# Patient Record
Sex: Female | Born: 1985 | Race: White | Hispanic: No | Marital: Single | Smoking: Former smoker
Health system: Southern US, Community
[De-identification: ages and names within clinical notes are randomized; demographics above are authoritative.]

## PROBLEM LIST (undated history)

## (undated) DIAGNOSIS — D649 Anemia, unspecified: Secondary | ICD-10-CM

## (undated) DIAGNOSIS — Z8659 Personal history of other mental and behavioral disorders: Secondary | ICD-10-CM

## (undated) DIAGNOSIS — Z8739 Personal history of other diseases of the musculoskeletal system and connective tissue: Secondary | ICD-10-CM

## (undated) DIAGNOSIS — M7989 Other specified soft tissue disorders: Secondary | ICD-10-CM

## (undated) DIAGNOSIS — F419 Anxiety disorder, unspecified: Secondary | ICD-10-CM

## (undated) DIAGNOSIS — G35D Multiple sclerosis, unspecified: Secondary | ICD-10-CM

## (undated) DIAGNOSIS — F319 Bipolar disorder, unspecified: Secondary | ICD-10-CM

## (undated) DIAGNOSIS — R12 Heartburn: Secondary | ICD-10-CM

## (undated) HISTORY — DX: Personal history of other diseases of the musculoskeletal system and connective tissue: Z87.39

## (undated) HISTORY — DX: Personal history of other mental and behavioral disorders: Z86.59

## (undated) HISTORY — DX: Anemia, unspecified: D64.9

## (undated) HISTORY — DX: Heartburn: R12

## (undated) HISTORY — DX: Multiple sclerosis, unspecified: G35.D

## (undated) HISTORY — DX: Anxiety disorder, unspecified: F41.9

## (undated) HISTORY — PX: NO PAST SURGERIES: SHX2092

## (undated) HISTORY — DX: Other specified soft tissue disorders: M79.89

## (undated) HISTORY — DX: Bipolar disorder, unspecified: F31.9

---

## 2008-04-14 ENCOUNTER — Encounter: Payer: Self-pay | Admitting: Family Medicine

## 2008-08-11 ENCOUNTER — Ambulatory Visit: Payer: Self-pay | Admitting: Family Medicine

## 2008-08-11 DIAGNOSIS — F329 Major depressive disorder, single episode, unspecified: Secondary | ICD-10-CM

## 2008-08-11 DIAGNOSIS — F909 Attention-deficit hyperactivity disorder, unspecified type: Secondary | ICD-10-CM | POA: Insufficient documentation

## 2008-08-11 DIAGNOSIS — F319 Bipolar disorder, unspecified: Secondary | ICD-10-CM | POA: Insufficient documentation

## 2008-08-11 DIAGNOSIS — F17200 Nicotine dependence, unspecified, uncomplicated: Secondary | ICD-10-CM | POA: Insufficient documentation

## 2019-05-12 ENCOUNTER — Ambulatory Visit: Payer: Self-pay | Attending: Internal Medicine

## 2019-05-12 ENCOUNTER — Other Ambulatory Visit: Payer: Self-pay

## 2019-05-12 DIAGNOSIS — Z23 Encounter for immunization: Secondary | ICD-10-CM

## 2019-05-12 NOTE — Progress Notes (Signed)
   Covid-19 Vaccination Clinic  Name:  Cindy Summers    MRN: [id number] DOB: [birth year]  05/12/2019  [name] was observed post Covid-19 immunization for 15 minutes without incident. She was provided with Vaccine Information Sheet and instruction to access the V-Safe system.   [name] was instructed to call 911 with any severe reactions post vaccine: Marland Kitchen Difficulty breathing  . Swelling of face and throat  . A fast heartbeat  . A bad rash all over body  . Dizziness and weakness   Immunizations Administered    Name Date Dose VIS Date Route   Pfizer COVID-19 Vaccine 05/12/2019  9:03 AM 0.3 mL 03/19/2018 Intramuscular   Manufacturer: [employer], [employer]   Lot: [id number]   NDC: [address]-8

## 2019-06-03 ENCOUNTER — Ambulatory Visit: Payer: Self-pay | Attending: Internal Medicine

## 2019-06-03 DIAGNOSIS — Z23 Encounter for immunization: Secondary | ICD-10-CM

## 2019-06-03 NOTE — Progress Notes (Signed)
   Covid-19 Vaccination Clinic  Name:  Cindy Summers    MRN: [id number] DOB: 03/12/1985  06/03/2019  [name] was observed post Covid-19 immunization for 15 minutes without incident. She was provided with Vaccine Information Sheet and instruction to access the V-Safe system.   [name] was instructed to call 911 with any severe reactions post vaccine: Marland Kitchen Difficulty breathing  . Swelling of face and throat  . A fast heartbeat  . A bad rash all over body  . Dizziness and weakness   Immunizations Administered    Name Date Dose VIS Date Route   Pfizer COVID-19 Vaccine 06/03/2019 10:06 AM 0.3 mL 03/19/2018 Intramuscular   Manufacturer: [employer], [employer]   Lot: [id number]   NDC: [address]-1

## 2020-12-02 ENCOUNTER — Ambulatory Visit (INDEPENDENT_AMBULATORY_CARE_PROVIDER_SITE_OTHER): Payer: Managed Care, Other (non HMO) | Admitting: Family Medicine

## 2020-12-02 ENCOUNTER — Encounter: Payer: Self-pay | Admitting: Family Medicine

## 2020-12-02 ENCOUNTER — Other Ambulatory Visit: Payer: Self-pay

## 2020-12-02 VITALS — BP 124/62 | HR 76 | Temp 98.1°F | Ht 67.0 in | Wt 154.0 lb

## 2020-12-02 DIAGNOSIS — F319 Bipolar disorder, unspecified: Secondary | ICD-10-CM

## 2020-12-02 DIAGNOSIS — Z720 Tobacco use: Secondary | ICD-10-CM

## 2020-12-02 DIAGNOSIS — Z23 Encounter for immunization: Secondary | ICD-10-CM | POA: Diagnosis not present

## 2020-12-02 DIAGNOSIS — L989 Disorder of the skin and subcutaneous tissue, unspecified: Secondary | ICD-10-CM | POA: Diagnosis not present

## 2020-12-02 DIAGNOSIS — F900 Attention-deficit hyperactivity disorder, predominantly inattentive type: Secondary | ICD-10-CM

## 2020-12-02 DIAGNOSIS — N926 Irregular menstruation, unspecified: Secondary | ICD-10-CM | POA: Insufficient documentation

## 2020-12-02 NOTE — Assessment & Plan Note (Signed)
Pt is not interested in cessation right now  Voices understanding of possible complications and risks

## 2020-12-02 NOTE — Assessment & Plan Note (Signed)
Pt has some very small keratotic flesh colored lesions on chin/close to mouth and one on tip of nose near nostril  Recommend eval at dermatology Pt plans to make the appt herself  Discussed the importance of sunscreen use

## 2020-12-02 NOTE — Patient Instructions (Addendum)
Calm and headspace are good apps for meditation  It may help  Try and be patient-it takes practice   Let me know if you become interested in counseling/therapy   Stop up front to sign a release so we can send for your gyn records then when I know I can start you back on it and get you back here for a gyn visit   Flu shot today   Take care of yourself   Follow up with dermatology regarding your face

## 2020-12-02 NOTE — Progress Notes (Signed)
Subjective:    Patient ID: Cindy Summers, female    DOB: 1985/04/11, 35 y.o.   MRN: [id number]  This visit occurred during the SARS-CoV-2 public health emergency.  Safety protocols were in place, including screening questions prior to the visit, additional usage of staff PPE, and extensive cleaning of exam room while observing appropriate contact time as indicated for disinfecting solutions.   HPI Pt presents to establish for care  Pt got lost and arrived an hour late so visit was cut short   Wt Readings from Last 3 Encounters:  12/02/20 154 lb (69.9 kg)   24.12 kg/m   BP Readings from Last 3 Encounters:  12/02/20 124/62   Pulse Readings from Last 3 Encounters:  12/02/20 76   She used to live in [place] [place]  Smoking status   Mental health history : has bipolar disorder  Sees Dr Evelene Croon  A lot of stressors  Broke up with her boyfriend  Her mother wants her to see a therapist   Has been on multiple medications  Some caused weight gain  Had a mental breakdown and moved home   Never felt 100% back to herself   She works at [employer]   She would rather not see a therapist  Is interested in spiritual issues  Has not done any meditation   Interested in micro dosing in the future  Would have to go out to [place]    ADHD: takes adderall   Adderall from Dr Evelene Croon (psychiatry)  [facility]   Gyn care - not in several years No abd paps   Used to be on birth control -went to [facility]  Oral contraceptive pill ? Of the name  Gets tired with menses   Not sexually active right now  Wants OC for period control  (stated it did not cause wt gain so possible yasmin/yaz?)  Vapes -with nicotine , puffs on through the day  No breathing problems  Spot on her face -concern her   Flu shot -wants today  Patient Active Problem List   Diagnosis Date Noted   Vapes nicotine containing substance 12/02/2020   Lesion of skin of face 12/02/2020   Menstrual problem  12/02/2020   Bipolar affective disorder (HCC) 08/11/2008   Attention deficit hyperactivity disorder (ADHD) 08/11/2008   Past Medical History:  Diagnosis Date   History of ADHD    History of anxiety disorder    History of bipolar disorder    Past Surgical History:  Procedure Laterality Date   NO PAST SURGERIES     Social History   Tobacco Use   Smoking status: Former    Types: Cigarettes   Smokeless tobacco: Never  Vaping Use   Vaping Use: Every day  Substance Use Topics   Alcohol use: Yes    Comment: occ   Drug use: Never   Family History  Problem Relation Age of Onset   ALS Father    Diabetes Maternal Grandmother    No Known Allergies Current Outpatient Medications on File Prior to Visit  Medication Sig Dispense Refill   amphetamine-dextroamphetamine (ADDERALL) 20 MG tablet Take 20 mg by mouth daily.     amphetamine-dextroamphetamine (ADDERALL) 30 MG tablet Take 1 tablet by mouth 2 (two) times daily.     lamoTRIgine (LAMICTAL) 150 MG tablet Take 150 mg by mouth daily.     No current facility-administered medications on file prior to visit.     Review of Systems  Constitutional:  Positive  for fatigue. Negative for activity change, appetite change, fever and unexpected weight change.  HENT:  Negative for congestion, ear pain, rhinorrhea, sinus pressure and sore throat.   Eyes:  Negative for pain, redness and visual disturbance.  Respiratory:  Negative for cough, shortness of breath and wheezing.   Cardiovascular:  Negative for chest pain and palpitations.  Gastrointestinal:  Negative for abdominal pain, blood in stool, constipation and diarrhea.  Endocrine: Negative for polydipsia and polyuria.  Genitourinary:  Negative for dysuria, frequency and urgency.  Musculoskeletal:  Negative for arthralgias, back pain and myalgias.  Skin:  Negative for pallor and rash.  Allergic/Immunologic: Negative for environmental allergies.  Neurological:  Negative for dizziness,  syncope and headaches.  Hematological:  Negative for adenopathy. Does not bruise/bleed easily.  Psychiatric/Behavioral:  Positive for decreased concentration and sleep disturbance. Negative for confusion and dysphoric mood. The patient is nervous/anxious.       Objective:   Physical Exam Constitutional:      General: She is not in acute distress.    Appearance: Normal appearance. She is normal weight. She is not ill-appearing or diaphoretic.  HENT:     Head: Normocephalic and atraumatic.     Mouth/Throat:     Mouth: Mucous membranes are moist.     Pharynx: Oropharynx is clear.  Eyes:     General: No scleral icterus.    Extraocular Movements: Extraocular movements intact.     Conjunctiva/sclera: Conjunctivae normal.     Pupils: Pupils are equal, round, and reactive to light.  Cardiovascular:     Rate and Rhythm: Normal rate and regular rhythm.     Heart sounds: Normal heart sounds.  Pulmonary:     Effort: Pulmonary effort is normal. No respiratory distress.     Breath sounds: Normal breath sounds.  Musculoskeletal:     Cervical back: Normal range of motion and neck supple.  Lymphadenopathy:     Cervical: No cervical adenopathy.  Skin:    General: Skin is warm and dry.     Coloration: Skin is not pale.     Findings: No erythema or rash.     Comments: Some tiny scattered keratotic pale lesions on R chin and scattered around mouth  One at entrance of R nare Some comedones as well  Neurological:     Mental Status: She is alert.     Cranial Nerves: No cranial nerve deficit.  Psychiatric:        Mood and Affect: Mood normal.     Comments: Pleasant and talkative          Assessment & Plan:   Problem List Items Addressed This Visit       Musculoskeletal and Integument   Lesion of skin of face    Pt has some very small keratotic flesh colored lesions on chin/close to mouth and one on tip of nose near nostril  Recommend eval at dermatology Pt plans to make the appt  herself  Discussed the importance of sunscreen use        Other   Bipolar affective disorder ([place])    Under care of Dr Lajoyce Corners in psychiatry for this and ADD Fairly controlled with lamictal   Family was urging her to do some counseling  She declines at this time Reviewed stressors/ coping techniques/symptoms/ support sources/ tx options and side effects in detail today  Is interested in meditation and perhaps alt medical treatment  Interested in micro dosing psychedelics in the future as well  Enc her  strongly to invest time in self care (diet/exercise)  Continue psychiatric f/u  Consider CBT later if she changes her mind No SI or thoughts of self harm at this time       Attention deficit hyperactivity disorder (ADHD) - Primary    Primarily inattentive  Also bipolar disorder   Taking adderall 30 mg bid  Under care of psychiatry Dr Delle Reining Per pt doing well       Vapes nicotine containing substance    Pt is not interested in cessation right now  Voices understanding of possible complications and risks       Menstrual problem    Pt voices desire to get on OC for menstrual control  Would like to be on what she took from [facility] in the [place] (she cannot remember) We will send for that info  Then plan f/u for gyn visit here       Other Visit Diagnoses     Need for influenza vaccination       Relevant Orders   Flu Vaccine QUAD 6+ mos PF IM (Fluarix Quad PF) (Completed)

## 2020-12-02 NOTE — Assessment & Plan Note (Signed)
Under care of Dr Delle Reining in psychiatry for this and ADD Fairly controlled with lamictal   Family was urging her to do some counseling  She declines at this time Reviewed stressors/ coping techniques/symptoms/ support sources/ tx options and side effects in detail today  Is interested in meditation and perhaps alt medical treatment  Interested in micro dosing psychedelics in the future as well  Enc her strongly to invest time in self care (diet/exercise)  Continue psychiatric f/u  Consider CBT later if she changes her mind No SI or thoughts of self harm at this time

## 2020-12-02 NOTE — Assessment & Plan Note (Signed)
Pt voices desire to get on OC for menstrual control  Would like to be on what she took from [facility] in the [place] (she cannot remember) We will send for that info  Then plan f/u for gyn visit here

## 2020-12-02 NOTE — Assessment & Plan Note (Signed)
Primarily inattentive  Also bipolar disorder   Taking adderall 30 mg bid  Under care of psychiatry Dr Delle Reining Per pt doing well

## 2021-02-03 ENCOUNTER — Encounter: Payer: 59 | Admitting: Family Medicine

## 2021-03-07 ENCOUNTER — Other Ambulatory Visit: Payer: Self-pay

## 2021-03-07 ENCOUNTER — Ambulatory Visit (INDEPENDENT_AMBULATORY_CARE_PROVIDER_SITE_OTHER): Payer: Managed Care, Other (non HMO) | Admitting: Family Medicine

## 2021-03-07 DIAGNOSIS — Z0000 Encounter for general adult medical examination without abnormal findings: Secondary | ICD-10-CM | POA: Insufficient documentation

## 2021-03-07 NOTE — Progress Notes (Signed)
Reschedule

## 2021-03-30 ENCOUNTER — Emergency Department (HOSPITAL_COMMUNITY): Payer: Managed Care, Other (non HMO)

## 2021-03-30 ENCOUNTER — Inpatient Hospital Stay (HOSPITAL_COMMUNITY)
Admission: EM | Admit: 2021-03-30 | Discharge: 2021-04-04 | DRG: 059 | Disposition: A | Discharge status: Home / Self Care | Payer: Managed Care, Other (non HMO) | Attending: Internal Medicine | Admitting: Internal Medicine

## 2021-03-30 ENCOUNTER — Encounter (HOSPITAL_COMMUNITY): Payer: Self-pay | Admitting: Emergency Medicine

## 2021-03-30 DIAGNOSIS — H469 Unspecified optic neuritis: Secondary | ICD-10-CM | POA: Diagnosis present

## 2021-03-30 DIAGNOSIS — Z87891 Personal history of nicotine dependence: Secondary | ICD-10-CM

## 2021-03-30 DIAGNOSIS — Z833 Family history of diabetes mellitus: Secondary | ICD-10-CM

## 2021-03-30 DIAGNOSIS — F319 Bipolar disorder, unspecified: Secondary | ICD-10-CM | POA: Diagnosis present

## 2021-03-30 DIAGNOSIS — H5340 Unspecified visual field defects: Secondary | ICD-10-CM

## 2021-03-30 DIAGNOSIS — H547 Unspecified visual loss: Secondary | ICD-10-CM | POA: Diagnosis not present

## 2021-03-30 DIAGNOSIS — F909 Attention-deficit hyperactivity disorder, unspecified type: Secondary | ICD-10-CM | POA: Diagnosis present

## 2021-03-30 DIAGNOSIS — Z20822 Contact with and (suspected) exposure to covid-19: Secondary | ICD-10-CM | POA: Diagnosis present

## 2021-03-30 DIAGNOSIS — G35 Multiple sclerosis: Principal | ICD-10-CM | POA: Diagnosis present

## 2021-03-30 DIAGNOSIS — F900 Attention-deficit hyperactivity disorder, predominantly inattentive type: Secondary | ICD-10-CM

## 2021-03-30 DIAGNOSIS — H468 Other optic neuritis: Secondary | ICD-10-CM | POA: Diagnosis present

## 2021-03-30 DIAGNOSIS — J309 Allergic rhinitis, unspecified: Secondary | ICD-10-CM

## 2021-03-30 LAB — RESP PANEL BY RT-PCR (FLU A&B, COVID) ARPGX2
Influenza A by PCR: NEGATIVE
Influenza B by PCR: NEGATIVE
SARS Coronavirus 2 by RT PCR: NEGATIVE

## 2021-03-30 LAB — CBC WITH DIFFERENTIAL/PLATELET
Abs Immature Granulocytes: 0.04 10*3/uL (ref 0.00–0.07)
Basophils Absolute: 0.1 10*3/uL (ref 0.0–0.1)
Basophils Relative: 1 %
Eosinophils Absolute: 0.1 10*3/uL (ref 0.0–0.5)
Eosinophils Relative: 1 %
HCT: 41.9 % (ref 36.0–46.0)
Hemoglobin: 14.3 g/dL (ref 12.0–15.0)
Immature Granulocytes: 0 %
Lymphocytes Relative: 18 %
Lymphs Abs: 1.7 10*3/uL (ref 0.7–4.0)
MCH: 30 pg (ref 26.0–34.0)
MCHC: 34.1 g/dL (ref 30.0–36.0)
MCV: 87.8 fL (ref 80.0–100.0)
Monocytes Absolute: 0.7 10*3/uL (ref 0.1–1.0)
Monocytes Relative: 7 %
Neutro Abs: 7.1 10*3/uL (ref 1.7–7.7)
Neutrophils Relative %: 73 %
Platelets: 310 10*3/uL (ref 150–400)
RBC: 4.77 MIL/uL (ref 3.87–5.11)
RDW: 11.9 % (ref 11.5–15.5)
WBC: 9.7 10*3/uL (ref 4.0–10.5)
nRBC: 0 % (ref 0.0–0.2)

## 2021-03-30 LAB — BASIC METABOLIC PANEL
Anion gap: 9 (ref 5–15)
BUN: 13 mg/dL (ref 6–20)
CO2: 24 mmol/L (ref 22–32)
Calcium: 9.3 mg/dL (ref 8.9–10.3)
Chloride: 106 mmol/L (ref 98–111)
Creatinine, Ser: 0.71 mg/dL (ref 0.44–1.00)
GFR, Estimated: >60 mL/min (ref >60)
Glucose, Bld: 114 mg/dL — ABNORMAL HIGH (ref 70–99)
Potassium: 4 mmol/L (ref 3.5–5.1)
Sodium: 139 mmol/L (ref 135–145)

## 2021-03-30 IMAGING — MR MR HEAD WO/W CM
7 of 13 series · 21 of 48 positions shown · IV contrast (gadavist)
Comparison: None.

CLINICAL DATA: Optic neuritis suspected

EXAM:
MRI HEAD AND ORBITS WITHOUT AND WITH CONTRAST
TECHNIQUE: Multiplanar, multiecho pulse sequences of the brain and surrounding
structures were obtained without and with intravenous contrast.
Multiplanar, multiecho pulse sequences of the orbits and surrounding
structures were obtained including fat saturation techniques, before
and after intravenous contrast administration.
CONTRAST:  7mL GADAVIST GADOBUTROL 1 MMOL/ML IV SOLN

[Series 2: DWI · axial · 3.0mm · 0.94mm/px · z∈[-78,+61]mm · 6 of 100 slices shown (1 of 2)]
[im 1/100]
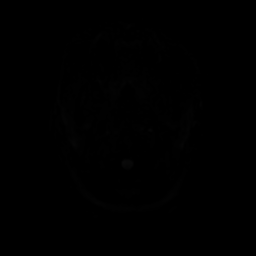
[im 20/100]
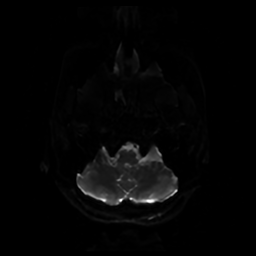
[im 40/100]
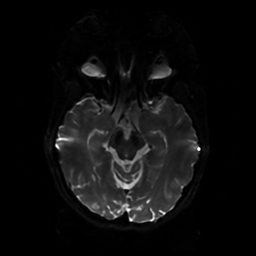
[im 60/100]
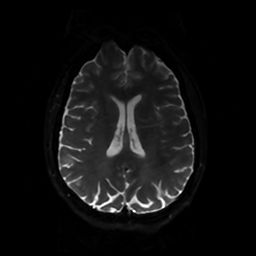
[im 80/100]
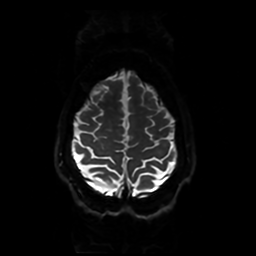
[im 100/100]
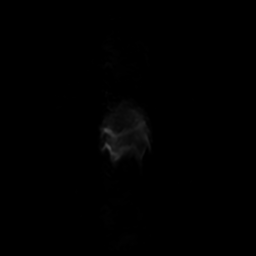

[Series 3: DWI · coronal · 4.0mm · 0.94mm/px · 5 of 73 slices shown (2 of 2)]
[im 1/73]
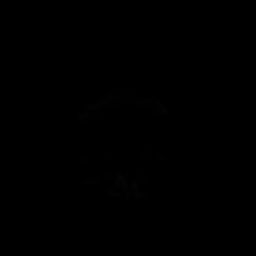
[im 19/73]
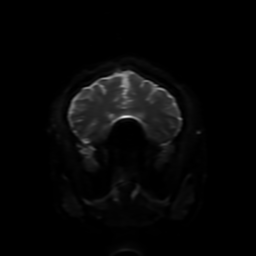
[im 37/73]
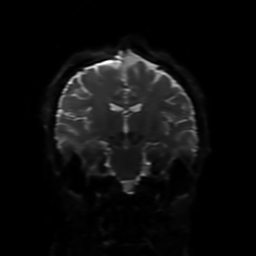
[im 55/73]
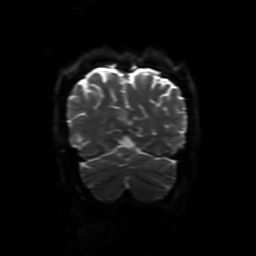
[im 73/73]
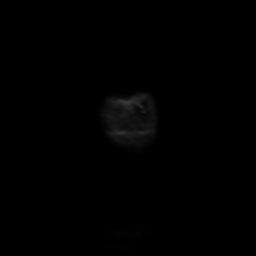

[Series 4: FLAIR · sagittal · 5.0mm · 0.23mm/px · 2 of 27 slices shown (1 of 2)]
[im 1/27]
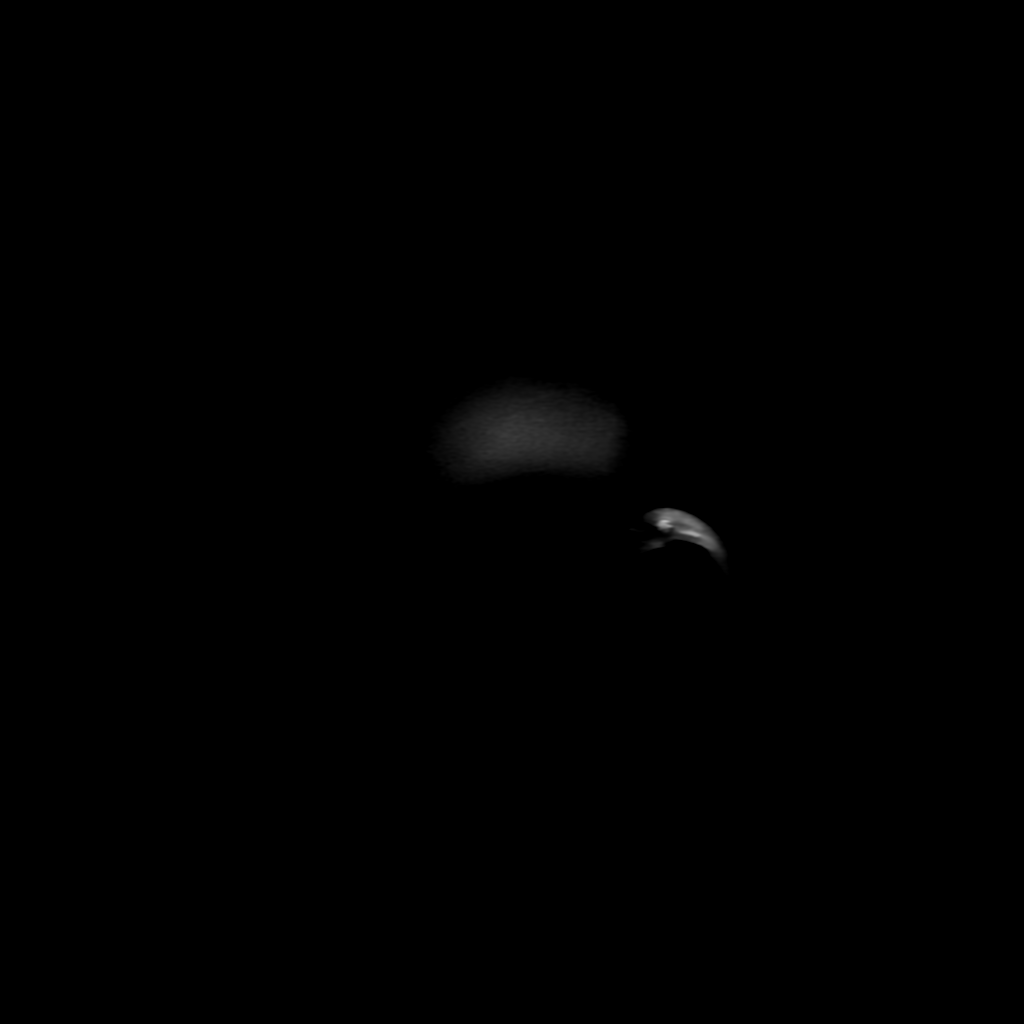
[im 27/27]
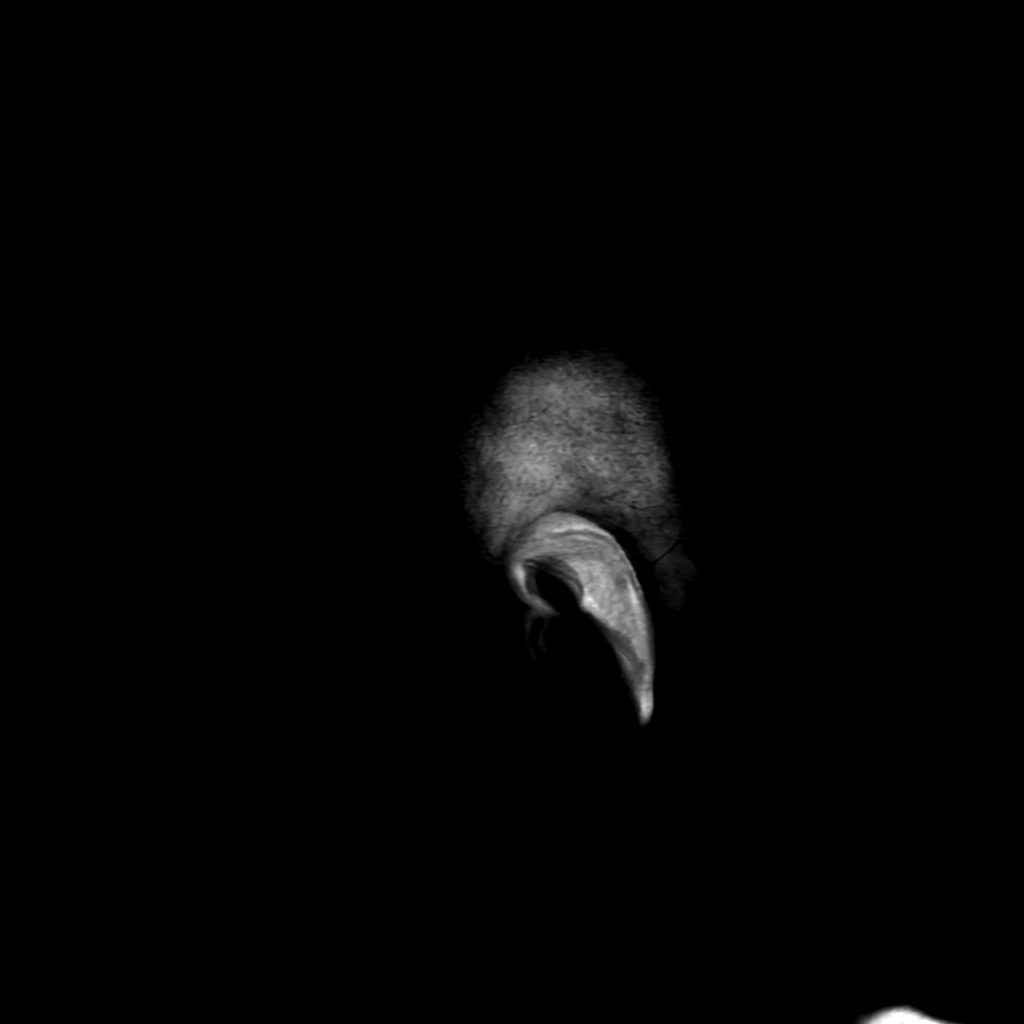

[Series 5: T2 · axial · 5.0mm · 0.23mm/px · 1 of 26 slices shown]
[im 1/26]
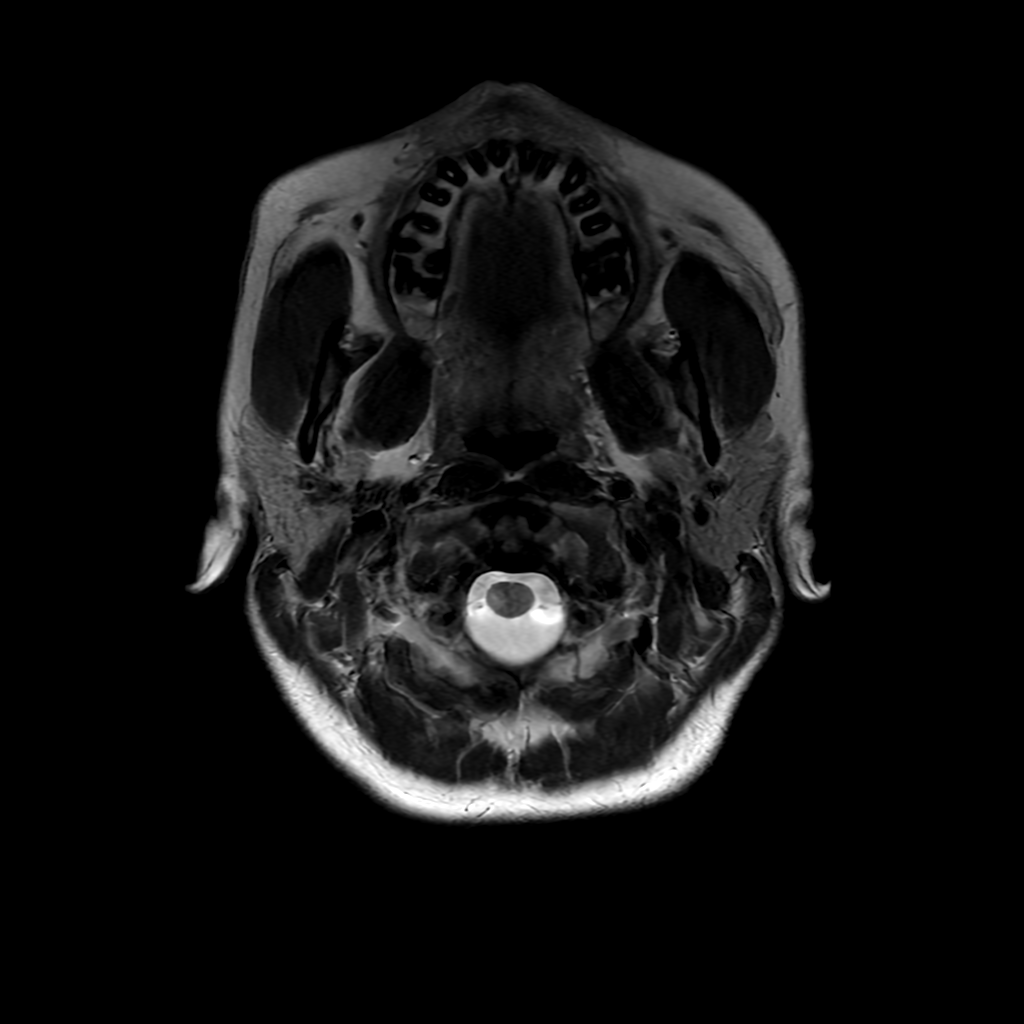

[Series 6: FLAIR · axial · 4.0mm · 0.45mm/px · z∈[-72,+70]mm · 2 of 35 slices shown (2 of 2)]
[im 1/35]
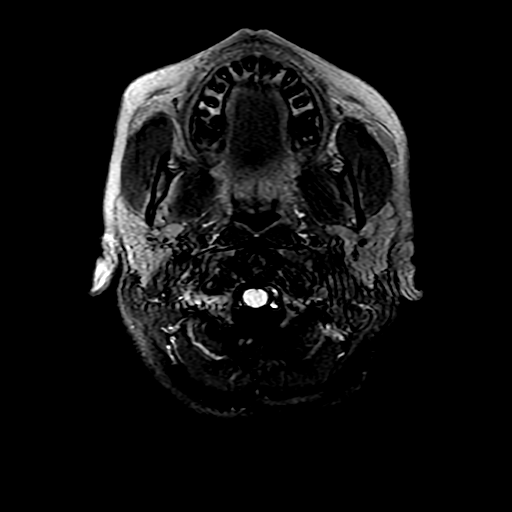
[im 35/35]
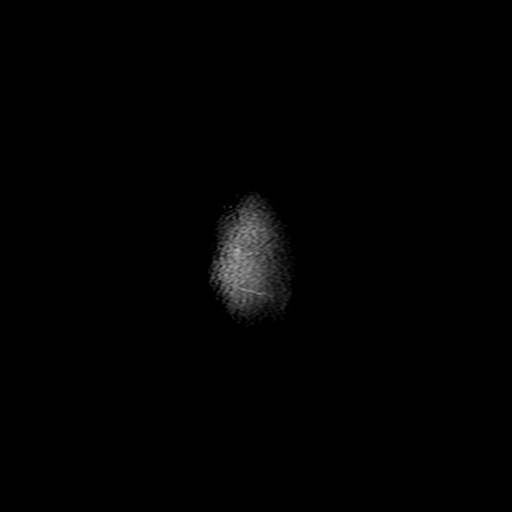

[Series 250: ADC · axial · 3.0mm · 0.94mm/px · z∈[-78,+61]mm · 3 of 47 slices shown (1 of 2)]
[im 1/47]
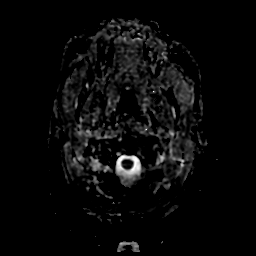
[im 24/47]
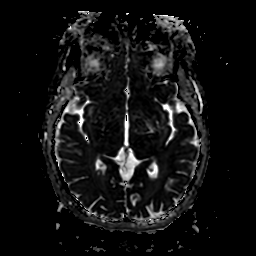
[im 47/47]
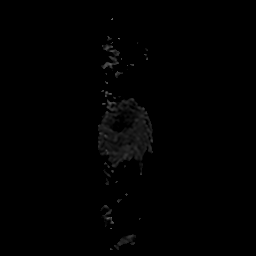

[Series 350: ADC · coronal · 4.0mm · 0.94mm/px · 2 of 37 slices shown (2 of 2)]
[im 1/37]
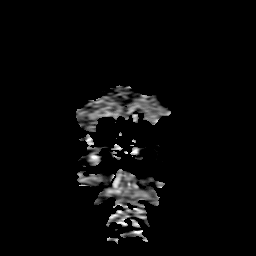
[im 37/37]
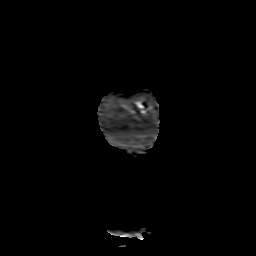

[21 of 48 positions shown; findings below may reference images not displayed]

FINDINGS: MRI HEAD FINDINGS

Brain: No restricted diffusion to suggest acute or subacute infarct.
No acute hemorrhage, mass, mass effect, or midline shift. No
hydrocephalus or extra-axial collection.

Foci of T2 hyperintense signal, which are somewhat radially oriented
from the ventricles (series 701, image 22 and 14), as well as
juxtacortical (series 701, image 13). No infratentorial T2
hyperintense signal. No abnormal parenchymal or meningeal
enhancement.

Vascular: Normal flow voids.

Skull and upper cervical spine: Normal marrow signal.

Other: The mastoids are well aerated.

MRI ORBITS FINDINGS

Orbits: Increased T2 hyperintense signal in the right optic nerve
sheath compared to the left (series 10, image 12 and series 9, image
13) without definite increased T2 signal within the nerve. Suspect
enhancement of the prechiasmatic right optic nerve (series 14, image
9 and series 15, image 12). The orbits are otherwise unremarkable.

Visualized sinuses: Small mucous retention cysts in the left ethmoid
air cells.

Soft tissues: Negative.
IMPRESSION: 1. Increased T2 hyperintense signal in the right optic nerve sheath
compared to the left, without definite increased T2 signal within
the right optic nerve, and with possible enhancement of the
prechiasmatic right optic nerve, concerning but not conclusive for
optic neuritis.
2. Foci of increased T2 signal that are somewhat radially oriented
from the ventricles as well as juxtacortical, which are nonspecific
but can be seen in the setting of demyelinating disease, such as
multiple sclerosis. No contrast enhancement to suggest active
demyelination.

## 2021-03-30 IMAGING — MR MR ORBITS WO/W CM
4 of 6 series · 18 of 48 positions shown · IV contrast (gadavist)
Comparison: None.

CLINICAL DATA: Optic neuritis suspected

EXAM:
MRI HEAD AND ORBITS WITHOUT AND WITH CONTRAST
TECHNIQUE: Multiplanar, multiecho pulse sequences of the brain and surrounding
structures were obtained without and with intravenous contrast.
Multiplanar, multiecho pulse sequences of the orbits and surrounding
structures were obtained including fat saturation techniques, before
and after intravenous contrast administration.
CONTRAST:  7mL GADAVIST GADOBUTROL 1 MMOL/ML IV SOLN

[Series 9: T2 fat-sat · coronal · 4.0mm · 0.35mm/px · 8 of 22 slices shown (1 of 2)]
[im 1/22]
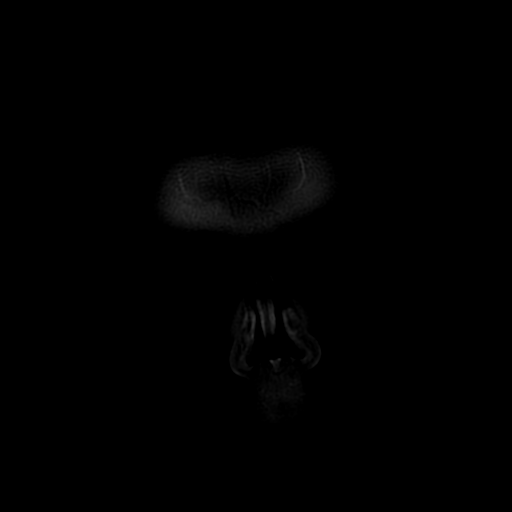
[im 4/22]
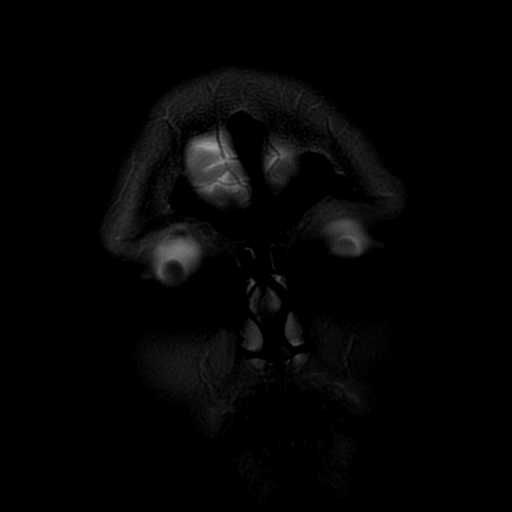
[im 7/22]
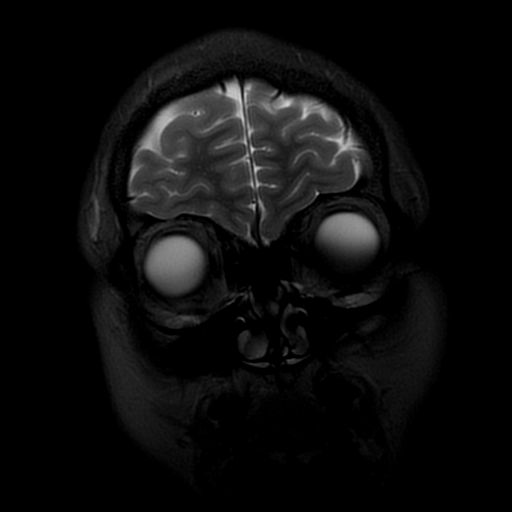
[im 10/22]
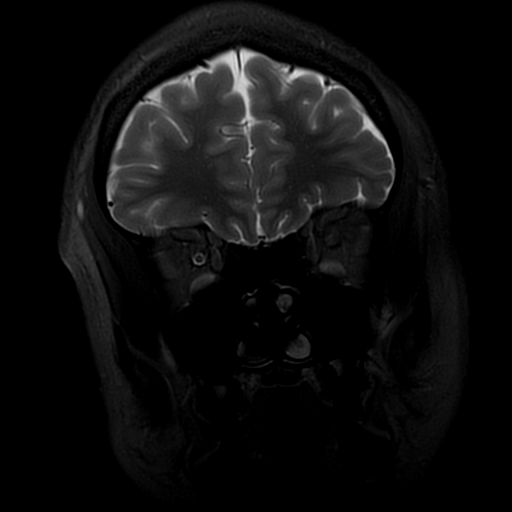
[im 13/22]
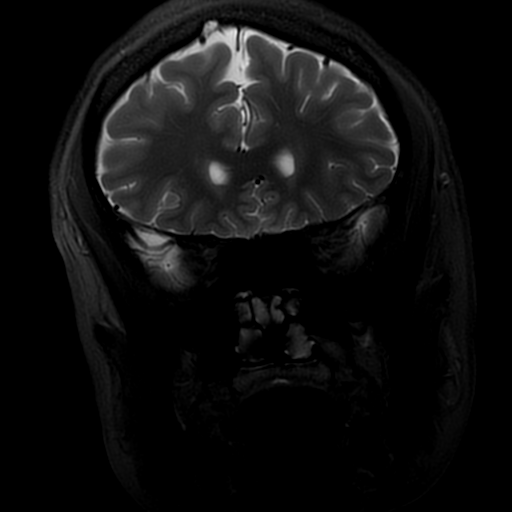
[im 16/22]
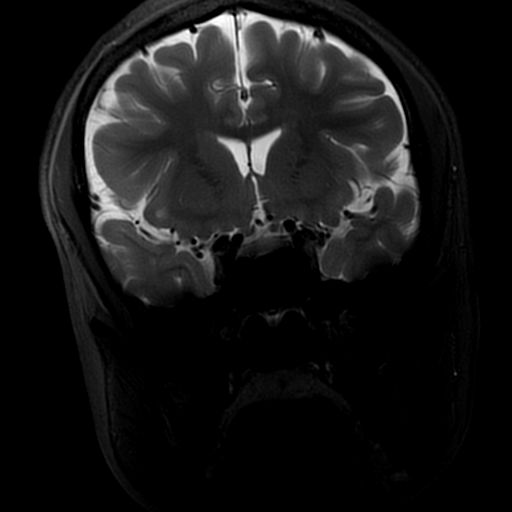
[im 19/22]
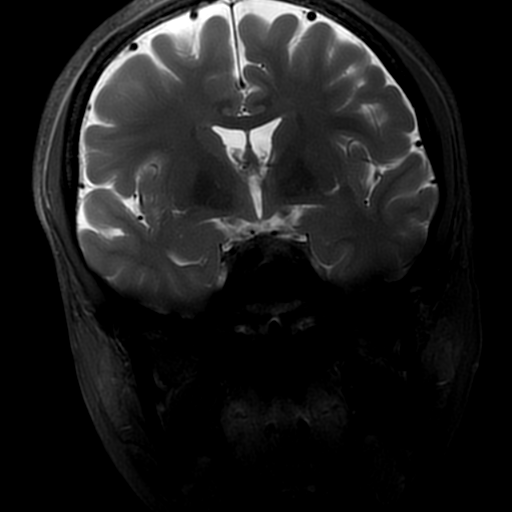
[im 22/22]
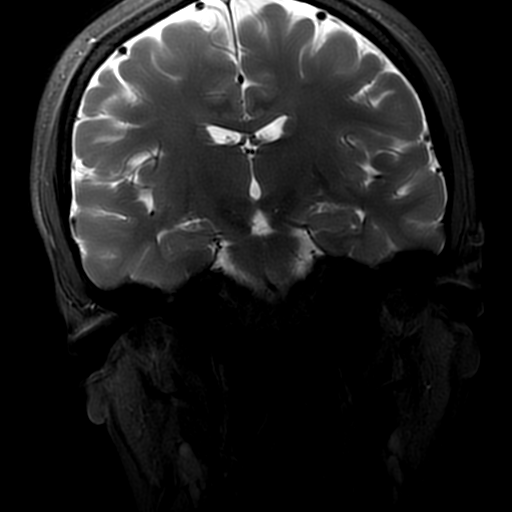

[Series 10: T2 fat-sat · axial · 3.0mm · 0.35mm/px · z∈[-39,+8]mm · 4 of 20 slices shown (2 of 2)]
[im 1/20]
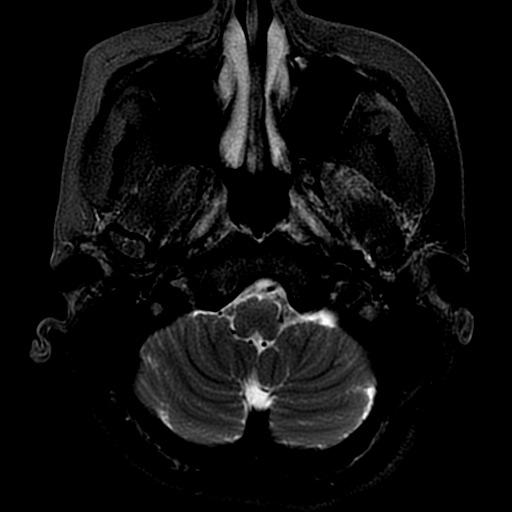
[im 3/20]
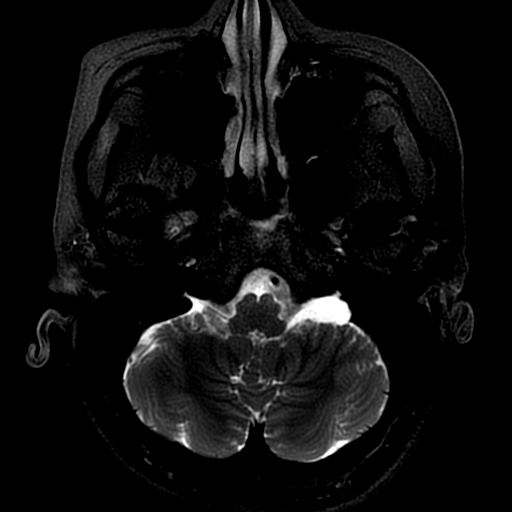
[im 11/20]
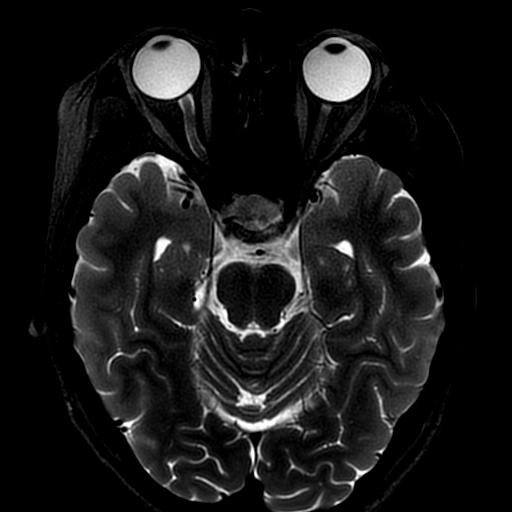
[im 17/20]
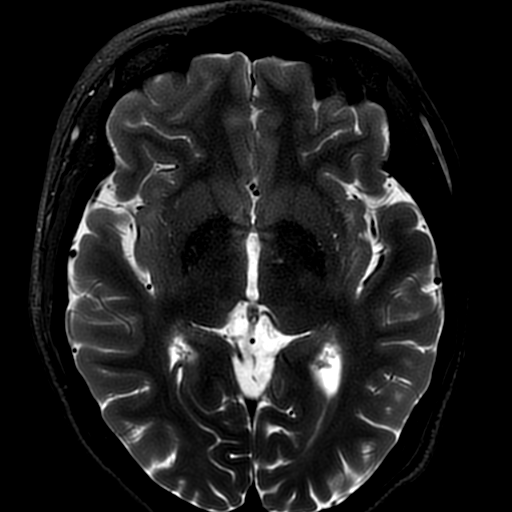

[Series 11: T1 · coronal · 4.0mm · 0.35mm/px · 3 of 22 slices shown (1 of 2)]
[im 4/22]
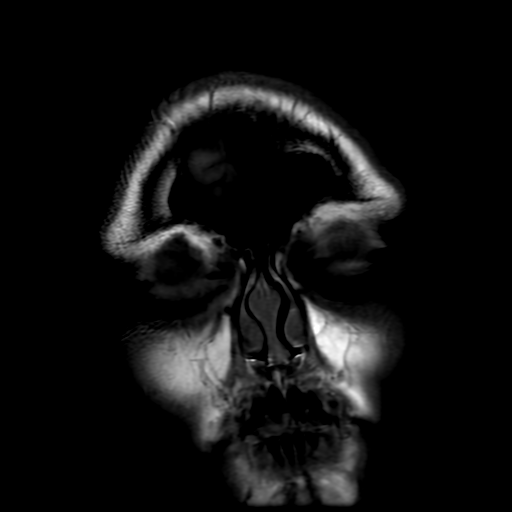
[im 13/22]
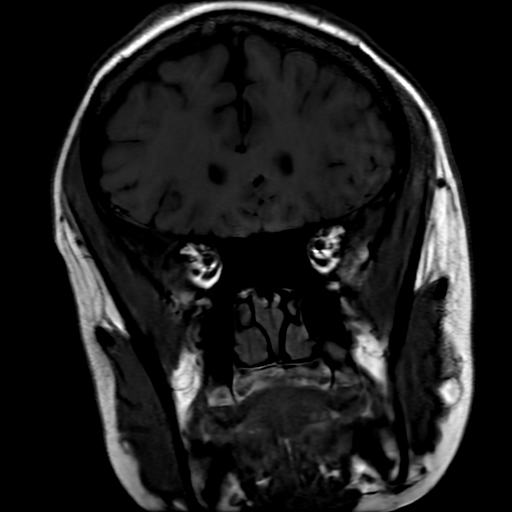
[im 19/22]
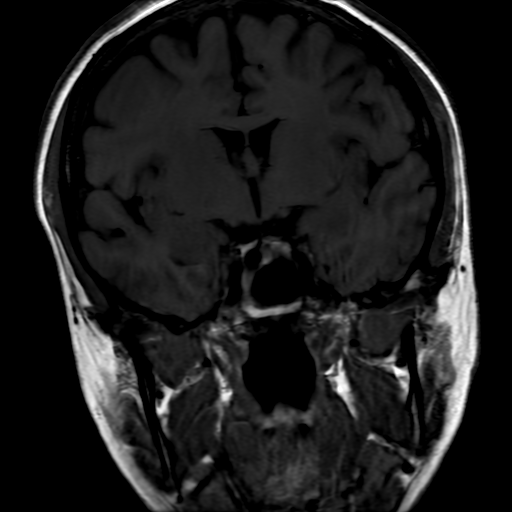

[Series 12: T1 · axial · 3.0mm · 0.35mm/px · z∈[-33,+8]mm · 3 of 20 slices shown (2 of 2)]
[im 3/20]
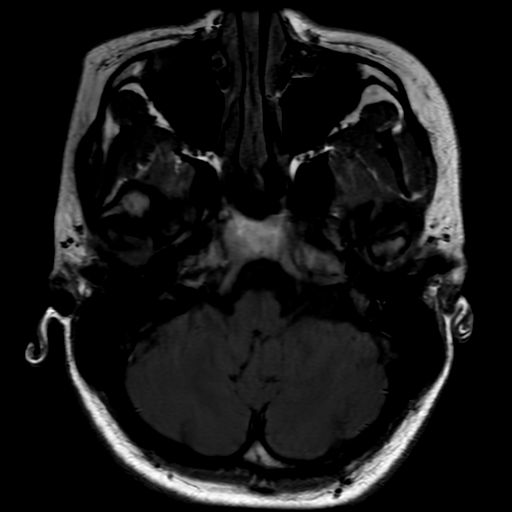
[im 11/20]
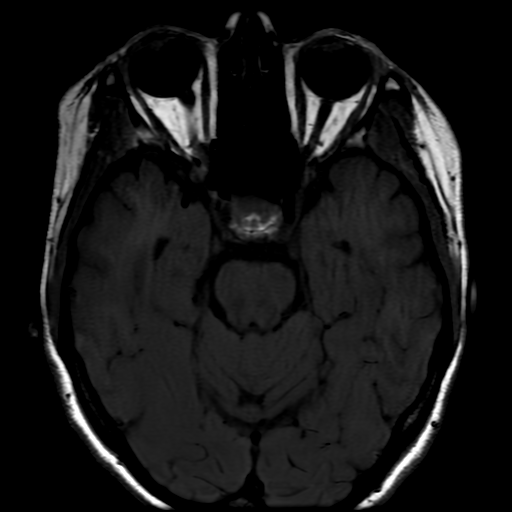
[im 17/20]
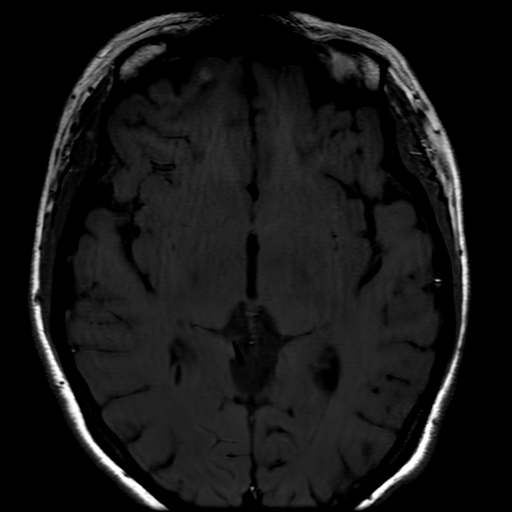

[18 of 48 positions shown; findings below may reference images not displayed]

FINDINGS: MRI HEAD FINDINGS

Brain: No restricted diffusion to suggest acute or subacute infarct.
No acute hemorrhage, mass, mass effect, or midline shift. No
hydrocephalus or extra-axial collection.

Foci of T2 hyperintense signal, which are somewhat radially oriented
from the ventricles (series 701, image 22 and 14), as well as
juxtacortical (series 701, image 13). No infratentorial T2
hyperintense signal. No abnormal parenchymal or meningeal
enhancement.

Vascular: Normal flow voids.

Skull and upper cervical spine: Normal marrow signal.

Other: The mastoids are well aerated.

MRI ORBITS FINDINGS

Orbits: Increased T2 hyperintense signal in the right optic nerve
sheath compared to the left (series 10, image 12 and series 9, image
13) without definite increased T2 signal within the nerve. Suspect
enhancement of the prechiasmatic right optic nerve (series 14, image
9 and series 15, image 12). The orbits are otherwise unremarkable.

Visualized sinuses: Small mucous retention cysts in the left ethmoid
air cells.

Soft tissues: Negative.
IMPRESSION: 1. Increased T2 hyperintense signal in the right optic nerve sheath
compared to the left, without definite increased T2 signal within
the right optic nerve, and with possible enhancement of the
prechiasmatic right optic nerve, concerning but not conclusive for
optic neuritis.
2. Foci of increased T2 signal that are somewhat radially oriented
from the ventricles as well as juxtacortical, which are nonspecific
but can be seen in the setting of demyelinating disease, such as
multiple sclerosis. No contrast enhancement to suggest active
demyelination.

## 2021-03-30 MED ORDER — GUANFACINE HCL ER 1 MG PO TB24
2.0000 mg | ORAL_TABLET | Freq: Every day | ORAL | Status: DC
  Administered 2021-03-31 – 2021-04-04 (×5): 2 mg via ORAL
  Filled 2021-03-30 (×5): qty 2

## 2021-03-30 MED ORDER — LACTATED RINGERS IV SOLN: INTRAVENOUS | Status: DC

## 2021-03-30 MED ORDER — HYDROXYZINE HCL 25 MG PO TABS
25.0000 mg | ORAL_TABLET | Freq: Once | ORAL | Status: AC
Start: 2021-03-30 — End: 2021-03-30
  Administered 2021-03-30: 25 mg via ORAL
  Filled 2021-03-30: qty 1

## 2021-03-30 MED ORDER — LORATADINE 10 MG PO TABS
10.0000 mg | ORAL_TABLET | Freq: Every day | ORAL | Status: DC
  Administered 2021-03-31 – 2021-04-04 (×5): 10 mg via ORAL
  Filled 2021-03-30 (×5): qty 1

## 2021-03-30 MED ORDER — ACETAMINOPHEN 650 MG RE SUPP: 650.0000 mg | Freq: Four times a day (QID) | RECTAL | Status: DC | PRN

## 2021-03-30 MED ORDER — ACETAMINOPHEN 325 MG PO TABS
650.0000 mg | ORAL_TABLET | Freq: Four times a day (QID) | ORAL | Status: DC | PRN
  Administered 2021-03-31 – 2021-04-01 (×2): 650 mg via ORAL
  Filled 2021-03-30 (×2): qty 2

## 2021-03-30 MED ORDER — GADOBUTROL 1 MMOL/ML IV SOLN
7.0000 mL | Freq: Once | INTRAVENOUS | Status: AC | PRN
  Administered 2021-03-30: 7 mL via INTRAVENOUS

## 2021-03-30 NOTE — Assessment & Plan Note (Addendum)
Continue home Adderall and guanfacine. ? ?  ? ?

## 2021-03-30 NOTE — H&P (Signed)
History and Physical    PLEASE NOTE THAT DRAGON DICTATION SOFTWARE WAS USED IN THE CONSTRUCTION OF THIS NOTE.   Cindy Summers TGG:[id number] DOB: [birth year] DOA: 03/30/2021  PCP: Abner Greenspan, MD  Patient coming from: home   I have personally briefly reviewed patient's old medical records in [place]  Chief Complaint: right sided visual field deficit  HPI: Cindy Summers is a 36 y.o. female with medical history significant for ADHD, bipolar disorder, who is admitted to [facility] on 03/30/2021 with acute right visual field defect after presenting from home to [facility] ED complaining of diminished vision in the right eye.   Initial reports acute onset of diminished vision in the right eye starting on Sunday, 03/27/2021.  She notes that this has been constant since onset, without any progression of the associated intensity or distribution of her visual field defect.  She specifically reports that when she is attempting to look straight forward, she has diminished vision in the inferior visual fields.  She denies any similar history of the above.  Not associate with any jaw claudication nor any headache.  Denies any ocular pain with extraocular movements.  Not associate with any subjective fever, chills, rigors, or generalized myalgias.  No recent trauma. Not associated with any acute focal weakness, acute focal numbness, paresthesias, facial droop, slurred speech, expressive aphasia, dysphagia, vertigo.  Denies any personal history of autoimmune or demyelinating disease, including no history of MS.  Denies any recent change in bladder habits, including no recent loss of bladder function/urinary incontinence.  No recent steroid use.  Denies any recent viral syndrome, including no recent cough, shortness of breath, rhinitis, rhinorrhea, sore throat, rash, abdominal pain, nausea, vomiting, diarrhea.  The patient conveys that even noted the above right sided visual field defect started on Sunday,  03/27/2021, that she did not seek medical attention until today, hoping that this new onset visual field deficit would be self-limited in nature.      ED Course:  Vital signs in the ED were notable for the following: Afebrile; heart rate 99-1 02; blood pressure 116/84; respiratory rate 14-17, oxygen saturation 97 to 100% on room air.  Labs were notable for the following: BMP notable for the following: Sodium 139, creatinine 0.71, glucose 114.  CBC notable for white blood cell count 9700.  Imaging and additional notable ED work-up: MRI brain with without contrast as well as MR orbits with and without contrast notable for increased T2 hyperintensity signal in the right optic nerve sheath, without definite increase in T2 signal within the right optic nerve itself, as well as demonstrating possible enhancement of the prechiasmatic right optic nerve, concerning for optic neuritis.  These MRI scans also were notable for showing foci of increased T2 signal that were nonspecific but reportedly can be seen in the setting of demyelinating disease, such as MS, but without evidence of active demyelination on these MRI scans.  No evidence of acute ischemic CVA.  EDP discussed patient's the patient's case and imaging with neurologist, Dr. Theda Sers, who will consult and recommends additional MRI imaging in the form of MRI cervical spine as well as MRI thoracic spine WITH IV contrast.  Dr. Theda Sers does not recommend any pharmacologic intervention at this time, including no initiation of systemic corticosteroids.  Subsequently, the patient was admitted for overnight observation for further evaluation and management of acute right visual field defect.   Review of Systems: As per HPI otherwise 10 point review of systems negative.  Past Medical History:  Diagnosis Date   History of ADHD    History of anxiety disorder    History of bipolar disorder     Past Surgical History:  Procedure Laterality Date   NO PAST  SURGERIES      Social History:  reports that she has quit smoking. Her smoking use included cigarettes. She has never used smokeless tobacco. She reports current alcohol use. She reports that she does not use drugs.   No Known Allergies  Family History  Problem Relation Age of Onset   ALS Father    Diabetes Maternal Grandmother     Family history reviewed and not pertinent    Prior to Admission medications   Medication Sig Start Date End Date Taking? Authorizing Provider  amphetamine-dextroamphetamine (ADDERALL) 30 MG tablet Take 30 mg by mouth 2 (two) times daily. 11/30/20  Yes [provider]  guanFACINE (INTUNIV) 2 MG TB24 ER tablet Take 2 mg by mouth daily. 02/25/21  Yes [provider]  Lactobacillus (PROBIOTIC ACIDOPHILUS PO) Take 1 tablet by mouth daily. RAE - probiotic and prebiotic.   Yes [provider]  loratadine (CLARITIN) 10 MG tablet Take 10 mg by mouth daily.   Yes [provider]  Multiple Vitamin (MULTIVITAMIN WITH MINERALS) TABS tablet Take 1 tablet by mouth daily.   Yes [provider]  OVER THE COUNTER MEDICATION Take 1 tablet by mouth daily. Daily cleanse   Yes [provider]  Propylene Glycol (SYSTANE COMPLETE OP) Place 1 drop into both eyes daily as needed (dry eyes).   Yes [provider]  amphetamine-dextroamphetamine (ADDERALL) 20 MG tablet Take 20 mg by mouth daily. Patient not taking: Reported on 03/30/2021 01/23/17   [provider]  lamoTRIgine (LAMICTAL) 150 MG tablet Take 150 mg by mouth daily. Patient not taking: Reported on 03/30/2021    [provider]     Objective    Physical Exam: Vitals:   03/30/21 1516 03/30/21 1811 03/30/21 2009  BP: (!) 157/87 116/84 (!) 146/88  Pulse: (!) 115 (!) 102 99  Resp: 14 16 17   Temp: 98.8 F (37.1 C)    TempSrc: Oral    SpO2: 100% 97% 98%    General: appears to be stated age; alert, oriented Skin: warm, dry, no rash Head:   AT/[place] Mouth:  Oral mucosa membranes appear moist, normal dentition Neck: supple; trachea midline Heart:  RRR; did not appreciate any M/R/G Lungs: CTAB, did not appreciate any wheezes, rales, or rhonchi Abdomen: + BS; soft, ND, NT Vascular: 2+ pedal pulses b/l; 2+ radial pulses b/l Extremities: no peripheral edema, no muscle wasting Neuro: 5/5 strength of the proximal and distal flexors and extensors of the upper and lower extremities bilaterally; sensation intact in upper and lower extremities b/l; cranial nerves II through XII grossly intact; no pronator drift; no evidence suggestive of slurred speech, dysarthria, or facial droop; right lower visual filed defects noted; Normal muscle tone. No tremors.    Labs on Admission: I have personally reviewed following labs and imaging studies  CBC: Recent Labs  Lab 03/30/21 1548  WBC 9.7  NEUTROABS 7.1  HGB 14.3  HCT 41.9  MCV 87.8  PLT 270   Basic Metabolic Panel: Recent Labs  Lab 03/30/21 1548  NA 139  K 4.0  CL 106  CO2 24  GLUCOSE 114*  BUN 13  CREATININE 0.71  CALCIUM 9.3   GFR: CrCl cannot be calculated (Unknown ideal weight.). Liver Function Tests: No results  for input(s): AST, ALT, ALKPHOS, BILITOT, PROT, ALBUMIN in the last 168 hours. No results for input(s): LIPASE, AMYLASE in the last 168 hours. No results for input(s): AMMONIA in the last 168 hours. Coagulation Profile: No results for input(s): INR, PROTIME in the last 168 hours. Cardiac Enzymes: No results for input(s): CKTOTAL, CKMB, CKMBINDEX, TROPONINI in the last 168 hours. BNP (last 3 results) No results for input(s): PROBNP in the last 8760 hours. HbA1C: No results for input(s): HGBA1C in the last 72 hours. CBG: No results for input(s): GLUCAP in the last 168 hours. Lipid Profile: No results for input(s): CHOL, HDL, LDLCALC, TRIG, CHOLHDL, LDLDIRECT in the last 72 hours. Thyroid Function Tests: No results for input(s): TSH, T4TOTAL, FREET4, T3FREE,  THYROIDAB in the last 72 hours. Anemia Panel: No results for input(s): VITAMINB12, FOLATE, FERRITIN, TIBC, IRON, RETICCTPCT in the last 72 hours. Urine analysis: No results found for: COLORURINE, APPEARANCEUR, LABSPEC, PHURINE, GLUCOSEU, HGBUR, BILIRUBINUR, KETONESUR, PROTEINUR, UROBILINOGEN, NITRITE, LEUKOCYTESUR  Radiological Exams on Admission: MR Brain W and Wo Contrast  Result Date: 03/30/2021 CLINICAL DATA:  Optic neuritis suspected EXAM: MRI HEAD AND ORBITS WITHOUT AND WITH CONTRAST TECHNIQUE: Multiplanar, multiecho pulse sequences of the brain and surrounding structures were obtained without and with intravenous contrast. Multiplanar, multiecho pulse sequences of the orbits and surrounding structures were obtained including fat saturation techniques, before and after intravenous contrast administration. CONTRAST:  85m GADAVIST GADOBUTROL 1 MMOL/ML IV SOLN COMPARISON:  None. FINDINGS: MRI HEAD FINDINGS Brain: No restricted diffusion to suggest acute or subacute infarct. No acute hemorrhage, mass, mass effect, or midline shift. No hydrocephalus or extra-axial collection. Foci of T2 hyperintense signal, which are somewhat radially oriented from the ventricles (series 701, image 22 and 14), as well as juxtacortical (series 701, image 13). No infratentorial T2 hyperintense signal. No abnormal parenchymal or meningeal enhancement. Vascular: Normal flow voids. Skull and upper cervical spine: Normal marrow signal. Other: The mastoids are well aerated. MRI ORBITS FINDINGS Orbits: Increased T2 hyperintense signal in the right optic nerve sheath compared to the left (series 10, image 12 and series 9, image 13) without definite increased T2 signal within the nerve. Suspect enhancement of the prechiasmatic right optic nerve (series 14, image 9 and series 15, image 12). The orbits are otherwise unremarkable. Visualized sinuses: Small mucous retention cysts in the left ethmoid air cells. Soft tissues: Negative.  IMPRESSION: 1. Increased T2 hyperintense signal in the right optic nerve sheath compared to the left, without definite increased T2 signal within the right optic nerve, and with possible enhancement of the prechiasmatic right optic nerve, concerning but not conclusive for optic neuritis. 2. Foci of increased T2 signal that are somewhat radially oriented from the ventricles as well as juxtacortical, which are nonspecific but can be seen in the setting of demyelinating disease, such as multiple sclerosis. No contrast enhancement to suggest active demyelination. Electronically Signed   By: AMerilyn BabaM.D.   On: 03/30/2021 18:40   MR ORBITS W WO CONTRAST  Result Date: 03/30/2021 CLINICAL DATA:  Optic neuritis suspected EXAM: MRI HEAD AND ORBITS WITHOUT AND WITH CONTRAST TECHNIQUE: Multiplanar, multiecho pulse sequences of the brain and surrounding structures were obtained without and with intravenous contrast. Multiplanar, multiecho pulse sequences of the orbits and surrounding structures were obtained including fat saturation techniques, before and after intravenous contrast administration. CONTRAST:  741mGADAVIST GADOBUTROL 1 MMOL/ML IV SOLN COMPARISON:  None. FINDINGS: MRI HEAD FINDINGS Brain: No restricted diffusion to suggest acute or subacute infarct. No acute  hemorrhage, mass, mass effect, or midline shift. No hydrocephalus or extra-axial collection. Foci of T2 hyperintense signal, which are somewhat radially oriented from the ventricles (series 701, image 22 and 14), as well as juxtacortical (series 701, image 13). No infratentorial T2 hyperintense signal. No abnormal parenchymal or meningeal enhancement. Vascular: Normal flow voids. Skull and upper cervical spine: Normal marrow signal. Other: The mastoids are well aerated. MRI ORBITS FINDINGS Orbits: Increased T2 hyperintense signal in the right optic nerve sheath compared to the left (series 10, image 12 and series 9, image 13) without definite increased  T2 signal within the nerve. Suspect enhancement of the prechiasmatic right optic nerve (series 14, image 9 and series 15, image 12). The orbits are otherwise unremarkable. Visualized sinuses: Small mucous retention cysts in the left ethmoid air cells. Soft tissues: Negative. IMPRESSION: 1. Increased T2 hyperintense signal in the right optic nerve sheath compared to the left, without definite increased T2 signal within the right optic nerve, and with possible enhancement of the prechiasmatic right optic nerve, concerning but not conclusive for optic neuritis. 2. Foci of increased T2 signal that are somewhat radially oriented from the ventricles as well as juxtacortical, which are nonspecific but can be seen in the setting of demyelinating disease, such as multiple sclerosis. No contrast enhancement to suggest active demyelination. Electronically Signed   By: Merilyn Baba M.D.   On: 03/30/2021 18:40      Assessment/Plan   Principal Problem:   Visual field defect Active Problems:   Attention deficit hyperactivity disorder (ADHD)   Allergic rhinitis      #) Acute right visual field defect: Acute right visual field defect involving the inferior visual fields, starting on Sunday, 03/27/2021, with persistence, without progression of the symptoms, in the absence of any additional symptoms, including no additional acute focal neurologic deficits including no evidence of jaw claudication to increase index of suspicion for temporal arteritis..  No history of prior, nor any history of autoimmune or demyelinating disease.  No preceding trauma nor any history of recent viral syndrome.  MRI brain as well as MRI orbits demonstrate no evidence of acute ischemic stroke, which show nonspecific findings, as outlined above, including optic neuritis as well as the possibility of MS, but in the context of no radiographic evidence to suggest active demyelination.  Case/imaging were discussed with the on-call neurologist, Dr.  Theda Sers, who will formally consult and recommends pursuit of additional MRI imaging, specifically MRI cervical and thoracic spine with contrast.  At this time, Dr. Zara Council does not recommend any pharmacologic intervention, including no initiation of systemic corticosteroids.  Plan: Neurology consulted, as above.  Every 4 hours neurochecks.  MRI cervical and thoracic spine with contrast, per recommendations associated with aforementioned neurology consult.  Check CRP, ESR.        #) Allergic Rhinitis: documented h/o such, on scheduled loratidine as outpatient, in the absence of intranasal steroids.   Plan: cont home loratadine.         #) ADHD: Documented history of such, both Adderall as well as Guanfacine as an outpatient.  In the setting of potential inflammatory process leading to the patient's presenting acute right visual field defect, including the possibility of optic neuritis, will hold preovulatory Adderall for now.  Plan: Hold home Adderall for now, as above.  Continue home Guanfacine .       DVT prophylaxis: SCD's   Code Status: Full code Family Communication: Case was discussed with the patient's mother, who is present at bedside  Disposition Plan: Per Rounding Team Consults called: Dr. Theda Sers of neurology, consulted, as further detailed above;  Admission status: Observation, med telemetry   PLEASE NOTE THAT DRAGON DICTATION SOFTWARE WAS USED IN THE CONSTRUCTION OF THIS NOTE.   [place] DO [facility]  From [place]   03/30/2021, 9:49 PM

## 2021-03-30 NOTE — ED Triage Notes (Signed)
Pt sent over by eye md for loss of vision in the lower part of her right eye , sent over for MRI of head and orbits, all symptoms started Sunday  ?

## 2021-03-30 NOTE — ED Provider Triage Note (Signed)
Emergency Medicine Provider Triage Evaluation Note ? ?Cindy Summers , a 36 y.o. female  was evaluated in triage.  Pt complains of sudden onset loss of vision of the right eye the lower half of the visual field for the last 2 days.  Patient was just seen by ophthalmology and sent here for MRI brain with and without, MR orbits with and without to rule out optic neuritis.  Patient denies any other symptoms, recent fever, chills.  Endorses a family history of ALS but no family history of multiple sclerosis.  She has not had any symptoms like this in the past, denies any other numbness, tingling, weakness, confusion. ? ?Review of Systems  ?Positive: Visual field deficit ?Negative: Weakness, numbness ? ?Physical Exam  ?BP (!) 157/87 (BP Location: Right Arm)   Pulse (!) 115   Temp 98.8 ?F (37.1 ?C) (Oral)   Resp 14   SpO2 100%  ?Gen:   Awake, no distress   ?Resp:  Normal effort  ?MSK:   Moves extremities without difficulty  ?Other:   ? ?Medical Decision Making  ?Medically screening exam initiated at 3:42 PM.  Appropriate orders placed.  ALEZA PEW was informed that the remainder of the evaluation will be completed by another provider, this initial triage assessment does not replace that evaluation, and the importance of remaining in the ED until their evaluation is complete. ? ?Workup initiated ?  ?[name], PA-C ?03/30/21 1544 ? ?

## 2021-03-30 NOTE — Assessment & Plan Note (Addendum)
Continue loratadine 

## 2021-03-30 NOTE — Assessment & Plan Note (Addendum)
Patient presented with acute right visual field defect involving inferior visual fields. Patient denies any personal or family history of demyelinating disease. No preceding trauma nor any history of recent viral syndrome. MRI brain as well as MRI orbits demonstrate no evidence of acute ischemic stroke, finding concerning for New MS diagnosis, optic neuritis. MRI C and T-spine finding consistent with chronic demyelinating lesion.  NMO IgG and Anti-MOG Ab were negative.  Neurology was consulted and recommended high-dose Solu-Medrol 1 g daily in which she completed a 5-day course.  During hospital course, patient vision slowly improved.  Patient was started on calcium and vitamin D supplementation.  Will need close follow-up outpatient with neurology, Dr. Epimenio Foot in 1-2 weeks.  ?

## 2021-03-30 NOTE — ED Provider Notes (Signed)
[facility] EMERGENCY DEPARTMENT Provider Note   CSN: [id number] Arrival date & time: 03/30/21  1506     History  Chief Complaint  Patient presents with   Loss of Vision    Cindy Summers is a 36 y.o. female. With history of bipolar disorder who presents to the emergency department with visual deficit.  Patient states that beginning Sunday she had difficulty seeing from her right eye. She states she also had headache behind her right eye. States "at first I thought it was a glare or just a blurry eye." States that Tuesday morning she couldn't see out of the bottom of her right eye. States she looked in the mirror and saw pupils were different sizes. She also notes on Tuesday at the [facility] she could not see the bottom row of letters on visual acuity.   She went to ophthalmologist today who sent her to ED for concern for optic neuritis. Currently, continues to have headache. Denies fever, head or neck trauma, weakness, numbness or tingling to her face or extremities.   HPI     Home Medications Prior to Admission medications   Medication Sig Start Date End Date Taking? Authorizing Provider  amphetamine-dextroamphetamine (ADDERALL) 20 MG tablet Take 20 mg by mouth daily. 01/23/17   [provider]  amphetamine-dextroamphetamine (ADDERALL) 30 MG tablet Take 1 tablet by mouth 2 (two) times daily. 11/30/20   [provider]  lamoTRIgine (LAMICTAL) 150 MG tablet Take 150 mg by mouth daily.    [provider]      Allergies    Patient has no known allergies.    Review of Systems   Review of Systems  Eyes:  Positive for visual disturbance.  All other systems reviewed and are negative.  Physical Exam Updated Vital Signs BP 116/84    Pulse (!) 102    Temp 98.8 F (37.1 C) (Oral)    Resp 16    SpO2 97%  Physical Exam Vitals and nursing note reviewed.  Constitutional:      General: She is not in acute distress.    Appearance: Normal appearance.  She is normal weight. She is not ill-appearing or toxic-appearing.  HENT:     Head: Normocephalic and atraumatic.     Nose: Nose normal.     Mouth/Throat:     Mouth: Mucous membranes are moist.     Pharynx: Oropharynx is clear.  Eyes:     General: Visual field deficit present. No scleral icterus.    Extraocular Movements: Extraocular movements intact.     Right eye: Normal extraocular motion and no nystagmus.     Left eye: Normal extraocular motion and no nystagmus.     Conjunctiva/sclera: Conjunctivae normal.     Pupils: Pupils are unequal.     Comments: Right pupil 20mm, left pupil 76mm   Cardiovascular:     Rate and Rhythm: Normal rate and regular rhythm.     Pulses: Normal pulses.     Heart sounds: Normal heart sounds. No murmur heard. Pulmonary:     Effort: Pulmonary effort is normal. No respiratory distress.     Breath sounds: Normal breath sounds.  Abdominal:     General: Bowel sounds are normal.     Palpations: Abdomen is soft.  Musculoskeletal:        General: Normal range of motion.     Cervical back: Normal range of motion and neck supple. No tenderness.  Skin:    General: Skin is warm  and dry.     Capillary Refill: Capillary refill takes less than 2 seconds.  Neurological:     General: No focal deficit present.     Mental Status: She is alert and oriented to person, place, and time. Mental status is at baseline.     GCS: GCS eye subscore is 4. GCS verbal subscore is 5. GCS motor subscore is 6.     Cranial Nerves: No cranial nerve deficit, dysarthria or facial asymmetry.     Sensory: Sensation is intact. No sensory deficit.     Motor: Motor function is intact. No weakness.  Psychiatric:        Mood and Affect: Mood normal.        Behavior: Behavior normal.        Thought Content: Thought content normal.        Judgment: Judgment normal.    ED Results / Procedures / Treatments   Labs (all labs ordered are listed, but only abnormal results are displayed) Labs  Reviewed  BASIC METABOLIC PANEL - Abnormal; Notable for the following components:      Result Value   Glucose, Bld 114 (*)    All other components within normal limits  RESP PANEL BY RT-PCR (FLU A&B, COVID) ARPGX2  CBC WITH DIFFERENTIAL/PLATELET  HIV ANTIBODY (ROUTINE TESTING W REFLEX)  MAGNESIUM  COMPREHENSIVE METABOLIC PANEL  CBC WITH DIFFERENTIAL/PLATELET  MAGNESIUM   EKG None  Radiology MR Brain W and Wo Contrast  Result Date: 03/30/2021 CLINICAL DATA:  Optic neuritis suspected EXAM: MRI HEAD AND ORBITS WITHOUT AND WITH CONTRAST TECHNIQUE: Multiplanar, multiecho pulse sequences of the brain and surrounding structures were obtained without and with intravenous contrast. Multiplanar, multiecho pulse sequences of the orbits and surrounding structures were obtained including fat saturation techniques, before and after intravenous contrast administration. CONTRAST:  5mL GADAVIST GADOBUTROL 1 MMOL/ML IV SOLN COMPARISON:  None. FINDINGS: MRI HEAD FINDINGS Brain: No restricted diffusion to suggest acute or subacute infarct. No acute hemorrhage, mass, mass effect, or midline shift. No hydrocephalus or extra-axial collection. Foci of T2 hyperintense signal, which are somewhat radially oriented from the ventricles (series 701, image 22 and 14), as well as juxtacortical (series 701, image 13). No infratentorial T2 hyperintense signal. No abnormal parenchymal or meningeal enhancement. Vascular: Normal flow voids. Skull and upper cervical spine: Normal marrow signal. Other: The mastoids are well aerated. MRI ORBITS FINDINGS Orbits: Increased T2 hyperintense signal in the right optic nerve sheath compared to the left (series 10, image 12 and series 9, image 13) without definite increased T2 signal within the nerve. Suspect enhancement of the prechiasmatic right optic nerve (series 14, image 9 and series 15, image 12). The orbits are otherwise unremarkable. Visualized sinuses: Small mucous retention cysts in  the left ethmoid air cells. Soft tissues: Negative. IMPRESSION: 1. Increased T2 hyperintense signal in the right optic nerve sheath compared to the left, without definite increased T2 signal within the right optic nerve, and with possible enhancement of the prechiasmatic right optic nerve, concerning but not conclusive for optic neuritis. 2. Foci of increased T2 signal that are somewhat radially oriented from the ventricles as well as juxtacortical, which are nonspecific but can be seen in the setting of demyelinating disease, such as multiple sclerosis. No contrast enhancement to suggest active demyelination. Electronically Signed   By: Merilyn Baba M.D.   On: 03/30/2021 18:40   MR ORBITS W WO CONTRAST  Result Date: 03/30/2021 CLINICAL DATA:  Optic neuritis suspected EXAM: MRI HEAD AND  ORBITS WITHOUT AND WITH CONTRAST TECHNIQUE: Multiplanar, multiecho pulse sequences of the brain and surrounding structures were obtained without and with intravenous contrast. Multiplanar, multiecho pulse sequences of the orbits and surrounding structures were obtained including fat saturation techniques, before and after intravenous contrast administration. CONTRAST:  34mL GADAVIST GADOBUTROL 1 MMOL/ML IV SOLN COMPARISON:  None. FINDINGS: MRI HEAD FINDINGS Brain: No restricted diffusion to suggest acute or subacute infarct. No acute hemorrhage, mass, mass effect, or midline shift. No hydrocephalus or extra-axial collection. Foci of T2 hyperintense signal, which are somewhat radially oriented from the ventricles (series 701, image 22 and 14), as well as juxtacortical (series 701, image 13). No infratentorial T2 hyperintense signal. No abnormal parenchymal or meningeal enhancement. Vascular: Normal flow voids. Skull and upper cervical spine: Normal marrow signal. Other: The mastoids are well aerated. MRI ORBITS FINDINGS Orbits: Increased T2 hyperintense signal in the right optic nerve sheath compared to the left (series 10, image  12 and series 9, image 13) without definite increased T2 signal within the nerve. Suspect enhancement of the prechiasmatic right optic nerve (series 14, image 9 and series 15, image 12). The orbits are otherwise unremarkable. Visualized sinuses: Small mucous retention cysts in the left ethmoid air cells. Soft tissues: Negative. IMPRESSION: 1. Increased T2 hyperintense signal in the right optic nerve sheath compared to the left, without definite increased T2 signal within the right optic nerve, and with possible enhancement of the prechiasmatic right optic nerve, concerning but not conclusive for optic neuritis. 2. Foci of increased T2 signal that are somewhat radially oriented from the ventricles as well as juxtacortical, which are nonspecific but can be seen in the setting of demyelinating disease, such as multiple sclerosis. No contrast enhancement to suggest active demyelination. Electronically Signed   By: Merilyn Baba M.D.   On: 03/30/2021 18:40    Procedures Procedures   Medications Ordered in ED Medications  hydrOXYzine (ATARAX) tablet 25 mg (has no administration in time range)  acetaminophen (TYLENOL) tablet 650 mg (has no administration in time range)    Or  acetaminophen (TYLENOL) suppository 650 mg (has no administration in time range)  gadobutrol (GADAVIST) 1 MMOL/ML injection 7 mL (7 mLs Intravenous Contrast Given 03/30/21 1759)  gadobutrol (GADAVIST) 1 MMOL/ML injection 7 mL (7 mLs Intravenous Contrast Given 03/30/21 1803)    ED Course/ Medical Decision Making/ A&P                           Medical Decision Making Amount and/or Complexity of Data Reviewed Radiology: ordered.  Risk Prescription drug management. Decision regarding hospitalization.  This patient presents to the ED for concern of visual field deficit, this involves an extensive number of treatment options, and is a complaint that carries with it a high risk of complications and morbidity.  The differential diagnosis  includes acute CVA, ICH, optic neuritis, retinal artery conclusion    Co morbidities that complicate the patient evaluation none  Additional history obtained:  Additional history obtained from: Mother at bedside External records from outside source obtained and reviewed including: Previous primary care visits  Lab Results: I personally ordered, reviewed, and interpreted labs. Pertinent results include: BMP within normal limits CBC within normal limits  Imaging Studies ordered:  I ordered imaging studies which included MRI.  I independently reviewed & interpreted imaging & am in agreement with radiology impression. Imaging shows: Increased T2 hyperintense signal at the right optic nerve sheath compared to the left, without definite  increased T2 signal within the right optic nerve, and with possible enhancement of the prechiasmatic right optic nerve, concerning but not conclusive for optic neuritis.  Foci of increased T2 signal that are somewhat radially oriented from the ventricles as well as juxtacortical which are nonspecific but can be seen in the setting of demyelinating disease such as multiple sclerosis.  Medications  I ordered medication including Atarax for anxiety Reevaluation of the patient after medication shows that patient improved  Consultations: I requested consultation with the neurologist, Dr. Theda Sers,  and discussed lab and imaging findings as well as pertinent plan - they recommend: MR cervical and thoracic spine with contrast.  At this time he does not recommend steroids.  He states that after MRI he will reevaluate the patient and can possibly see multiple sclerosis doctor outpatient with outpatient steroids.  ED Course: 36 year old female who presents to the emergency department with visual field deficit.  Vision is unilateral with no other focal neurological deficit so doubt stroke, patient exam and history make retinal detachment, vitreous hemorrhage, posterior  vitreous detachment lower on differential.  No foreign body sensation or foreign body on exam so doubt corneal abrasion, ulcer.  No recent eye trauma or suspected microtrauma with no signs of inflammation or injection with no significant photophobia so doubt globe rupture, uveitis, endophthalmitis.  MRIs concerning for optic neuritis versus multiple sclerosis.  I called Dr. Theda Sers, neurologist, who is more concerned about multiple sclerosis at this time.  He recommends MRI cervical and thoracic spine with contrast for further evaluation.  MRI called and stated that they cannot perform MR cervical or thoracic spine for 12 hours given that the patient has already received contrast.  I called the hospitalist to admit for observation so she can have MRI in the morning.  Patient is agreeable with this plan.  Spoke with Dr. Nadara Mustard or who agrees with admission.  In the meantime I have given the patient Atarax that she is having some anxiety with potential diagnosis.  After consideration of the diagnostic results and the patients response to treatment, I feel that the patent would benefit from admission. The patient has been appropriately medically screened and/or stabilized in the ED. I have low suspicion for any other emergent medical condition which would require further screening, evaluation or treatment in the ED.   Final Clinical Impression(s) / ED Diagnoses Final diagnoses:  Visual loss    Rx / DC Orders ED Discharge Orders     None         Mickie Hillier, PA-C [id number]    Campbell Stall P, DO [phone] [phone]

## 2021-03-31 ENCOUNTER — Other Ambulatory Visit: Payer: Self-pay

## 2021-03-31 ENCOUNTER — Encounter (HOSPITAL_COMMUNITY): Payer: Self-pay | Admitting: Internal Medicine

## 2021-03-31 ENCOUNTER — Observation Stay (HOSPITAL_COMMUNITY): Payer: Managed Care, Other (non HMO)

## 2021-03-31 DIAGNOSIS — Z20822 Contact with and (suspected) exposure to covid-19: Secondary | ICD-10-CM | POA: Diagnosis present

## 2021-03-31 DIAGNOSIS — H5340 Unspecified visual field defects: Secondary | ICD-10-CM | POA: Diagnosis not present

## 2021-03-31 DIAGNOSIS — Z87891 Personal history of nicotine dependence: Secondary | ICD-10-CM | POA: Diagnosis not present

## 2021-03-31 DIAGNOSIS — J309 Allergic rhinitis, unspecified: Secondary | ICD-10-CM | POA: Diagnosis present

## 2021-03-31 DIAGNOSIS — G379 Demyelinating disease of central nervous system, unspecified: Secondary | ICD-10-CM | POA: Diagnosis not present

## 2021-03-31 DIAGNOSIS — H468 Other optic neuritis: Secondary | ICD-10-CM | POA: Diagnosis present

## 2021-03-31 DIAGNOSIS — H547 Unspecified visual loss: Secondary | ICD-10-CM

## 2021-03-31 DIAGNOSIS — F319 Bipolar disorder, unspecified: Secondary | ICD-10-CM | POA: Diagnosis present

## 2021-03-31 DIAGNOSIS — F909 Attention-deficit hyperactivity disorder, unspecified type: Secondary | ICD-10-CM | POA: Diagnosis present

## 2021-03-31 DIAGNOSIS — Z833 Family history of diabetes mellitus: Secondary | ICD-10-CM | POA: Diagnosis not present

## 2021-03-31 DIAGNOSIS — G35 Multiple sclerosis: Principal | ICD-10-CM

## 2021-03-31 DIAGNOSIS — H469 Unspecified optic neuritis: Secondary | ICD-10-CM | POA: Diagnosis not present

## 2021-03-31 LAB — COMPREHENSIVE METABOLIC PANEL
ALT: 12 U/L (ref 0–44)
AST: 16 U/L (ref 15–41)
Albumin: 3.7 g/dL (ref 3.5–5.0)
Alkaline Phosphatase: 53 U/L (ref 38–126)
Anion gap: 9 (ref 5–15)
BUN: 12 mg/dL (ref 6–20)
CO2: 23 mmol/L (ref 22–32)
Calcium: 8.9 mg/dL (ref 8.9–10.3)
Chloride: 107 mmol/L (ref 98–111)
Creatinine, Ser: 0.63 mg/dL (ref 0.44–1.00)
GFR, Estimated: >60 mL/min (ref >60)
Glucose, Bld: 86 mg/dL (ref 70–99)
Potassium: 3.5 mmol/L (ref 3.5–5.1)
Sodium: 139 mmol/L (ref 135–145)
Total Bilirubin: 0.7 mg/dL (ref 0.3–1.2)
Total Protein: 6.3 g/dL — ABNORMAL LOW (ref 6.5–8.1)

## 2021-03-31 LAB — SEDIMENTATION RATE: Sed Rate: 4 mm/hr (ref 0–22)

## 2021-03-31 LAB — MAGNESIUM: Magnesium: 1.9 mg/dL (ref 1.7–2.4)

## 2021-03-31 LAB — CBC WITH DIFFERENTIAL/PLATELET
Abs Immature Granulocytes: 0.03 10*3/uL (ref 0.00–0.07)
Basophils Absolute: 0.1 10*3/uL (ref 0.0–0.1)
Basophils Relative: 1 %
Eosinophils Absolute: 0.2 10*3/uL (ref 0.0–0.5)
Eosinophils Relative: 3 %
HCT: 41 % (ref 36.0–46.0)
Hemoglobin: 13.5 g/dL (ref 12.0–15.0)
Immature Granulocytes: 0 %
Lymphocytes Relative: 36 %
Lymphs Abs: 3.1 10*3/uL (ref 0.7–4.0)
MCH: 29.3 pg (ref 26.0–34.0)
MCHC: 32.9 g/dL (ref 30.0–36.0)
MCV: 88.9 fL (ref 80.0–100.0)
Monocytes Absolute: 0.7 10*3/uL (ref 0.1–1.0)
Monocytes Relative: 8 %
Neutro Abs: 4.6 10*3/uL (ref 1.7–7.7)
Neutrophils Relative %: 52 %
Platelets: 282 10*3/uL (ref 150–400)
RBC: 4.61 MIL/uL (ref 3.87–5.11)
RDW: 12.1 % (ref 11.5–15.5)
WBC: 8.8 10*3/uL (ref 4.0–10.5)
nRBC: 0 % (ref 0.0–0.2)

## 2021-03-31 LAB — HIV ANTIBODY (ROUTINE TESTING W REFLEX): HIV Screen 4th Generation wRfx: NONREACTIVE

## 2021-03-31 LAB — C-REACTIVE PROTEIN: CRP: 0.6 mg/dL (ref <1.0)

## 2021-03-31 IMAGING — MR MR CERVICAL SPINE WO/W CM
10 of 20 series · 19 of 48 positions shown · IV contrast (gadavist)
Comparison: None.

CLINICAL DATA: Optic neuritis, possible multiple sclerosis

EXAM:
MRI CERVICAL AND THORACIC SPINE WITHOUT AND WITH CONTRAST
TECHNIQUE: Multiplanar and multiecho pulse sequences of the cervical spine, to
include the craniocervical junction and cervicothoracic junction,
and the thoracic spine, were obtained without and with intravenous
contrast.
CONTRAST:  7mL GADAVIST GADOBUTROL 1 MMOL/ML IV SOLN

[Series 2: T2 · sagittal · 3.0mm · 0.43mm/px · 2 of 15 slices shown (1 of 4)]
[im 1/15]
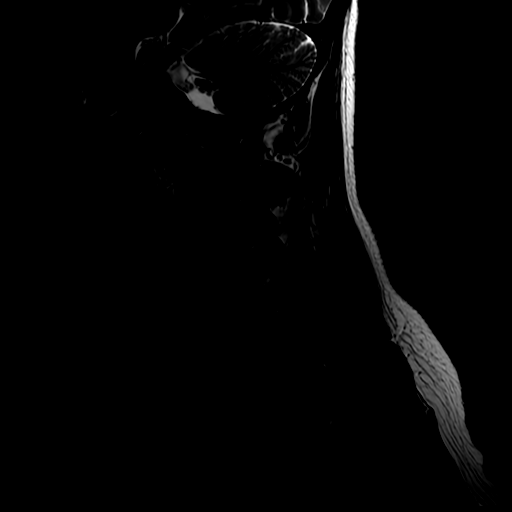
[im 15/15]
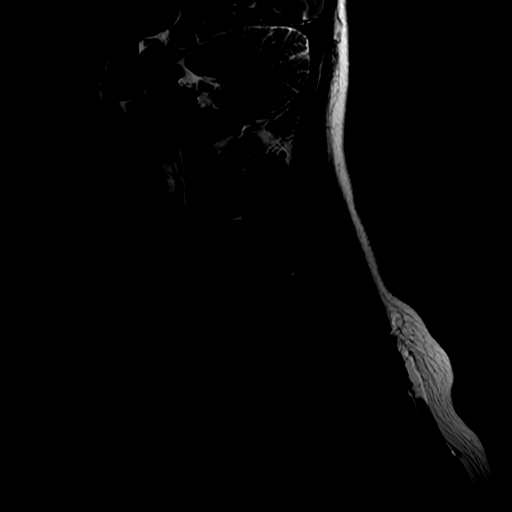

[Series 6: T2 · axial · 3.0mm · 0.35mm/px · z∈[-44,+73]mm · 3 of 36 slices shown (2 of 4)]
[im 1/36]
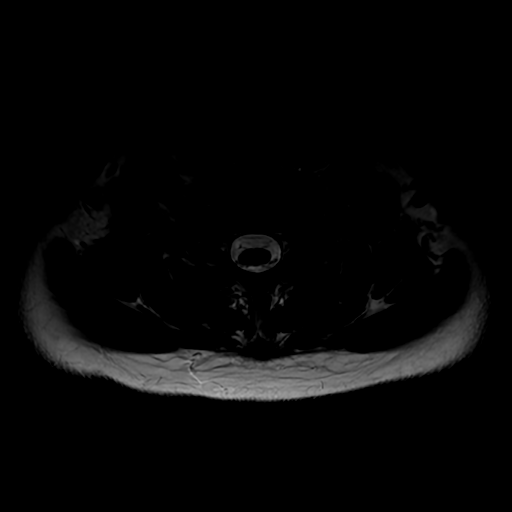
[im 18/36]
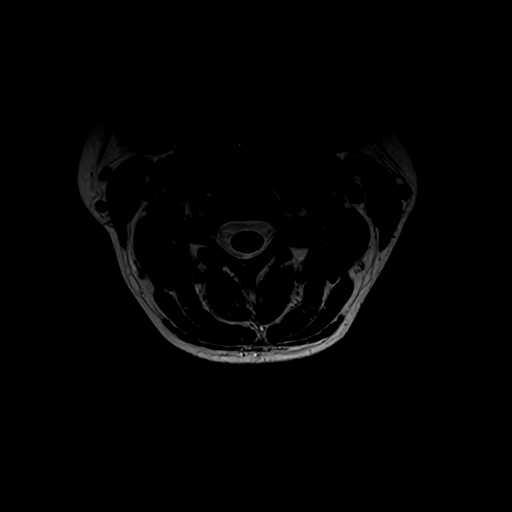
[im 36/36]
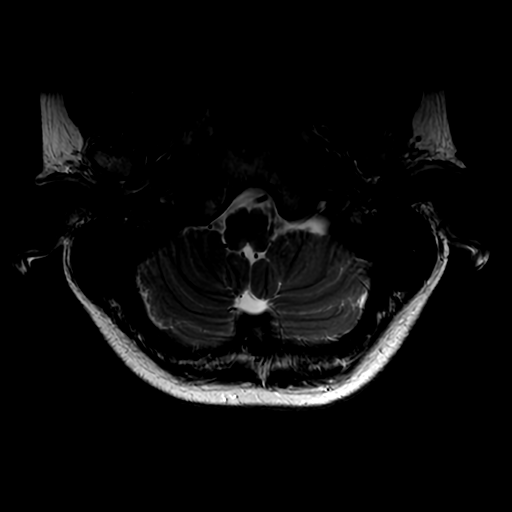

[Series 7: T1 · axial · non-contrast · 3.0mm · 0.35mm/px · z∈[-44,+73]mm · 3 of 36 slices shown (1 of 4)]
[im 1/36]
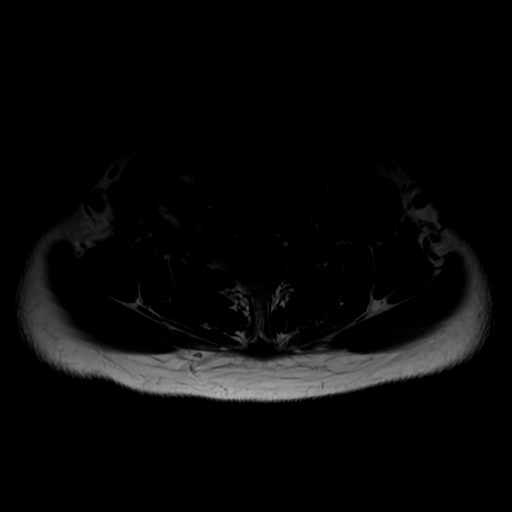
[im 18/36]
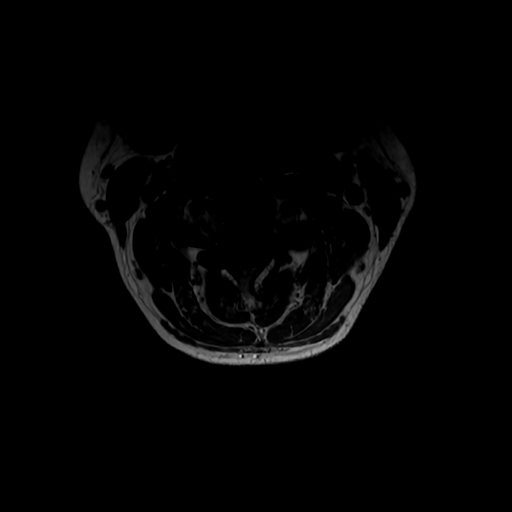
[im 36/36]
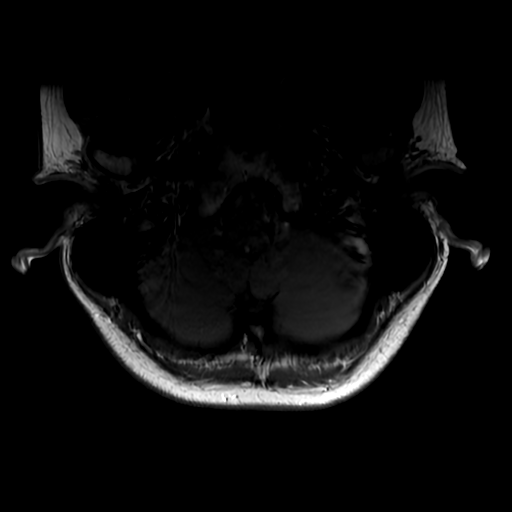

[Series 9: T1 · sagittal · 3.0mm · 0.90mm/px · 1 of 14 slices shown (2 of 4)]
[im 1/14]
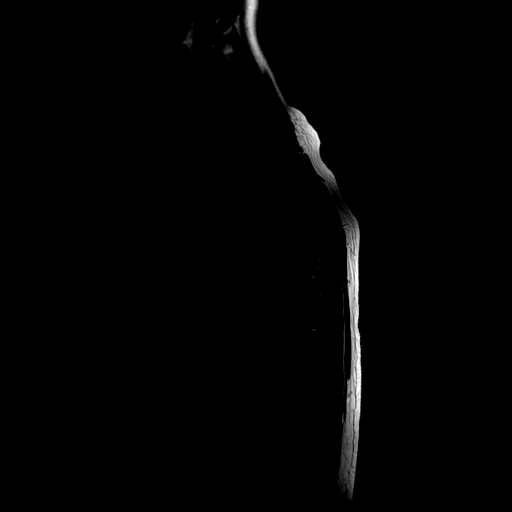

[Series 10: T2 · sagittal · 3.0mm · 0.66mm/px · 1 of 15 slices shown (3 of 4)]
[im 1/15]
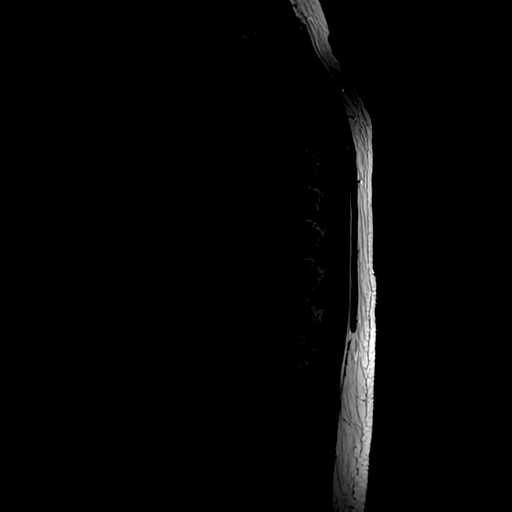

[Series 12: T1 · sagittal · 3.0mm · 0.66mm/px · 1 of 15 slices shown (3 of 4)]
[im 1/15]
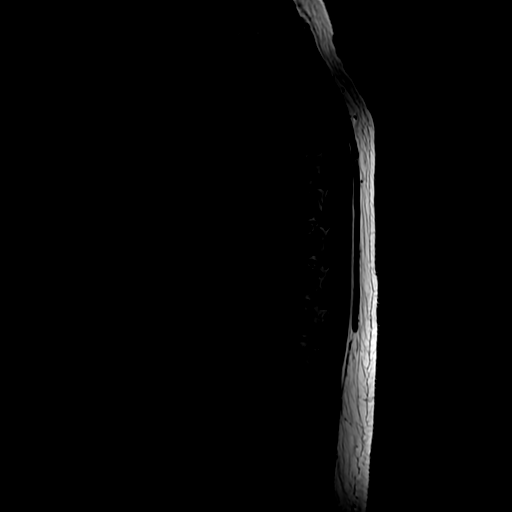

[Series 13: T2 · axial · 4.0mm · 0.39mm/px · z∈[-288,-77]mm · 3 of 35 slices shown (4 of 4)]
[im 1/35]
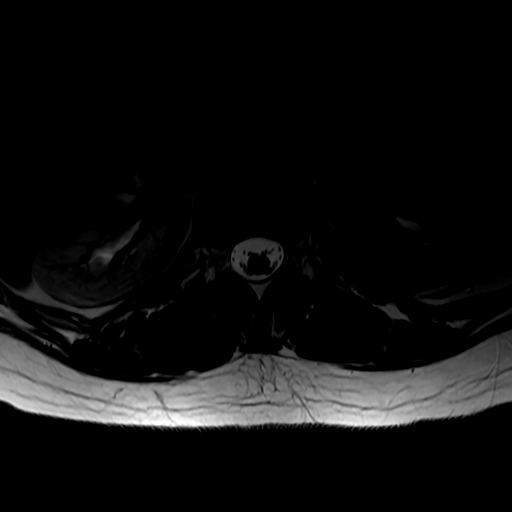
[im 18/35]
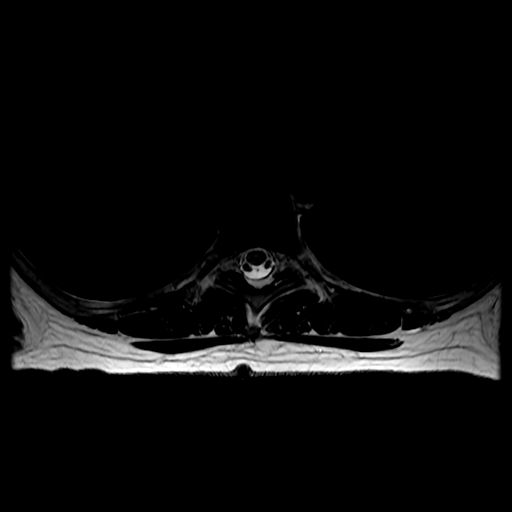
[im 35/35]
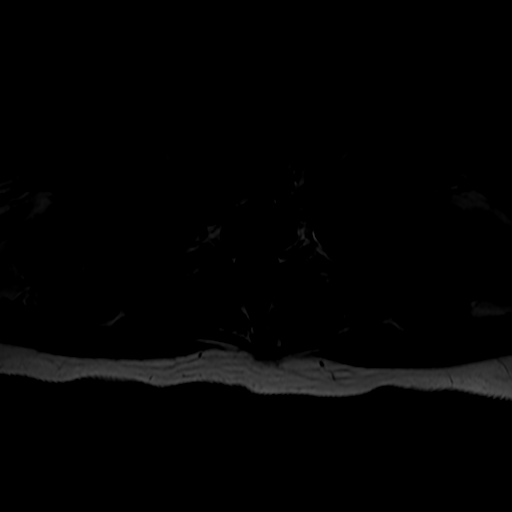

[Series 15: T1 · axial · non-contrast · 4.0mm · 0.39mm/px · z∈[-288,-77]mm · 3 of 35 slices shown (4 of 4)]
[im 1/35]
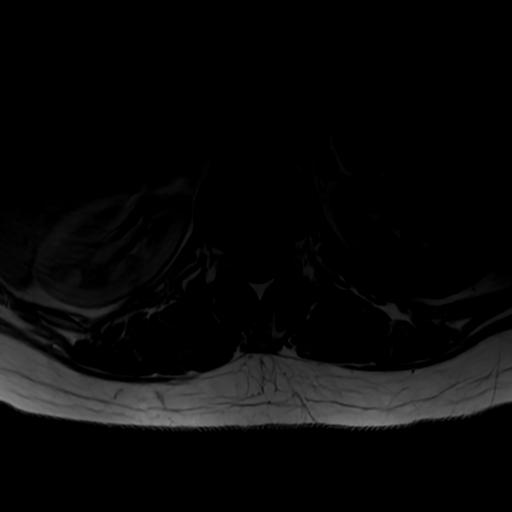
[im 18/35]
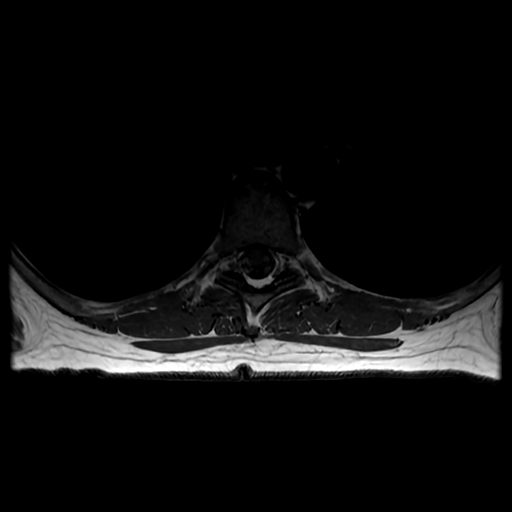
[im 35/35]
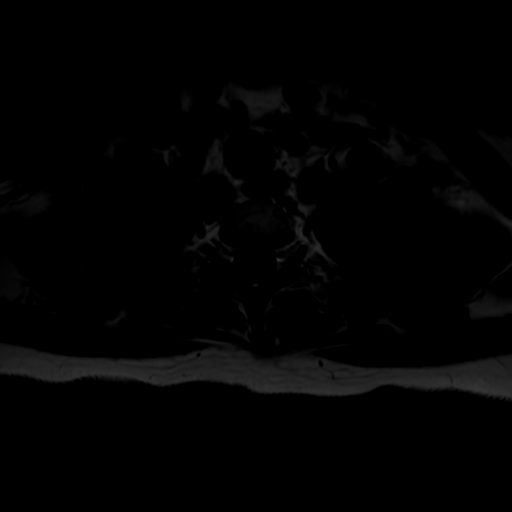

[Series 18: T1 fat-sat post-contrast · sagittal · 3.0mm · 0.66mm/px · 1 of 10 slices shown (1 of 2)]
[im 1/10]
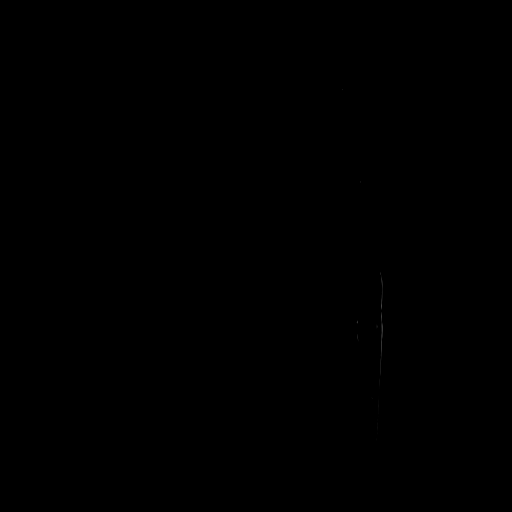

[Series 20: T1 fat-sat post-contrast · sagittal · 3.0mm · 0.66mm/px · 1 of 15 slices shown (2 of 2)]
[im 1/15]
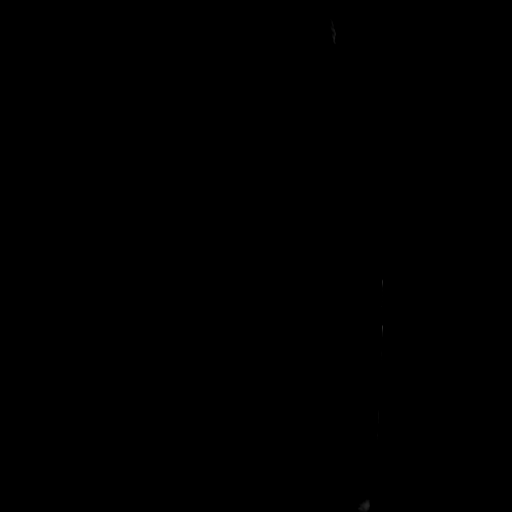

[19 of 48 positions shown; findings below may reference images not displayed]

FINDINGS: MRI CERVICAL SPINE

Alignment: Preserved.

Vertebrae: Vertebral body heights are maintained. No marrow edema.
No suspicious osseous lesion.

Cord: Small nonenhancing foci of T2 hyperintensity at the right and
left lateral aspect of the cord at the C1 level.

Posterior Fossa, vertebral arteries, paraspinal tissues:
Unremarkable. Intracranial findings are discussed on prior dedicated
study.

Disc levels: Intervertebral disc heights and signal are maintained.
There is a disc bulge with endplate osteophytes at C5-C6 without
stenosis.

MRI THORACIC SPINE

Alignment:  Preserved.

Vertebrae: Vertebral body heights are maintained. No marrow edema.
No suspicious osseous lesion.

Cord:  No definite abnormal signal.  No abnormal enhancement.

Paraspinal and other soft tissues: Unremarkable.

Disc levels:

Intervertebral disc heights and signal are maintained. There is no
significant disc herniation or stenosis at any level.
IMPRESSION: Abnormal cord T2 hyperintensity at the C1 level suspicious for
chronic demyelination given MRI brain findings.

No abnormal thoracic cord signal.

No abnormal enhancement to suggest active demyelination.

## 2021-03-31 IMAGING — MR MR THORACIC SPINE WO/W CM
10 of 20 series · 19 of 48 positions shown · IV contrast (gadavist)
Comparison: None.

CLINICAL DATA: Optic neuritis, possible multiple sclerosis

EXAM:
MRI CERVICAL AND THORACIC SPINE WITHOUT AND WITH CONTRAST
TECHNIQUE: Multiplanar and multiecho pulse sequences of the cervical spine, to
include the craniocervical junction and cervicothoracic junction,
and the thoracic spine, were obtained without and with intravenous
contrast.
CONTRAST:  7mL GADAVIST GADOBUTROL 1 MMOL/ML IV SOLN

[Series 2: T2 · sagittal · 3.0mm · 0.43mm/px · 2 of 15 slices shown (1 of 4)]
[im 1/15]
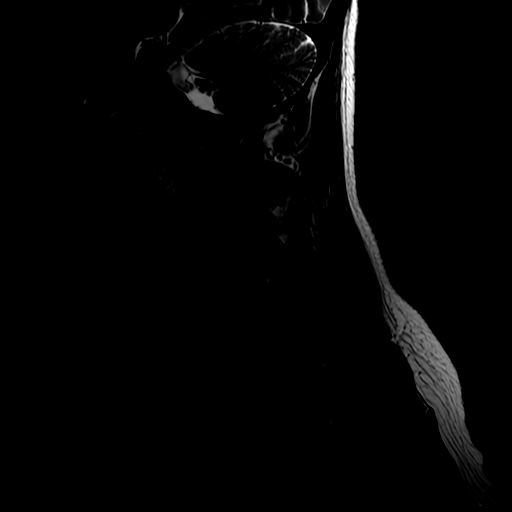
[im 15/15]
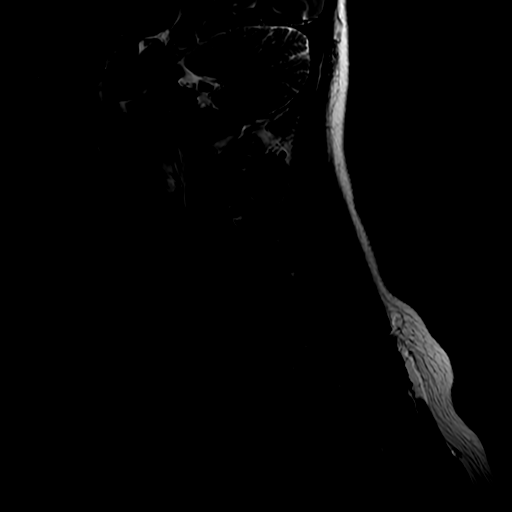

[Series 6: T2 · axial · 3.0mm · 0.35mm/px · z∈[-44,+73]mm · 3 of 36 slices shown (2 of 4)]
[im 1/36]
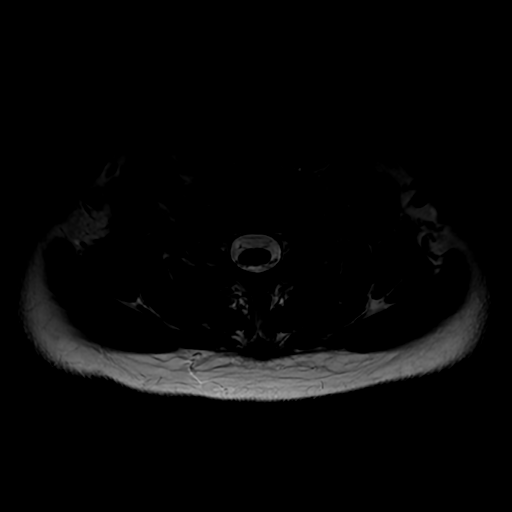
[im 18/36]
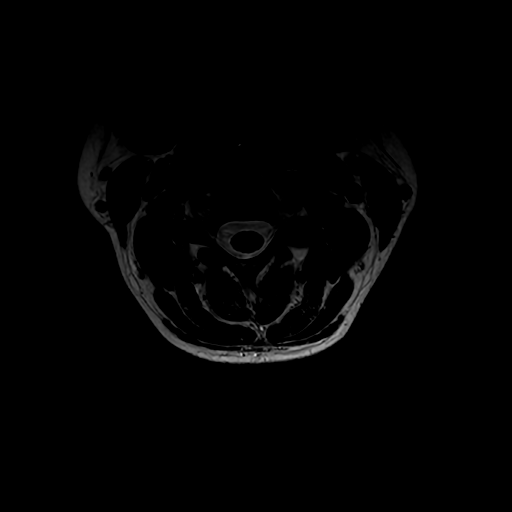
[im 36/36]
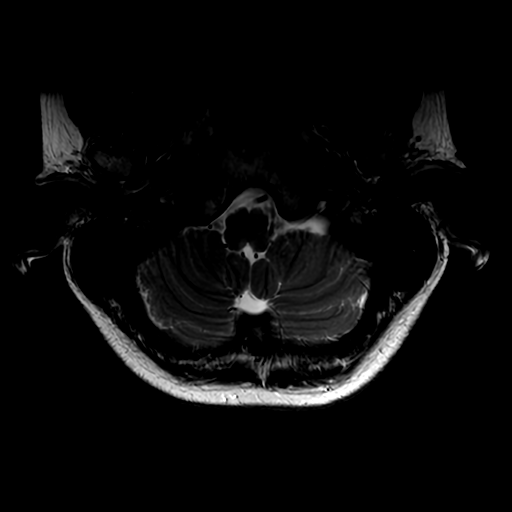

[Series 7: T1 · axial · non-contrast · 3.0mm · 0.35mm/px · z∈[-44,+73]mm · 3 of 36 slices shown (1 of 4)]
[im 1/36]
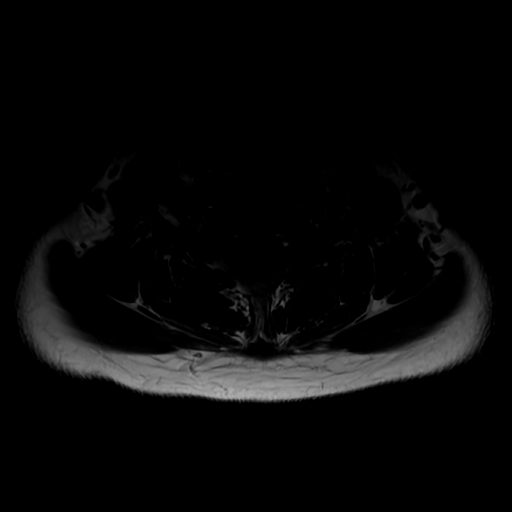
[im 18/36]
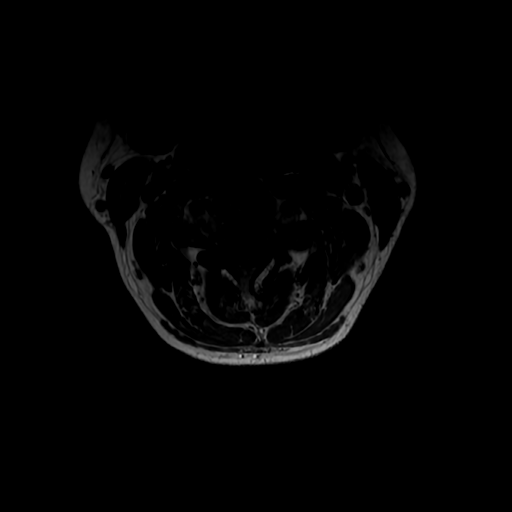
[im 36/36]
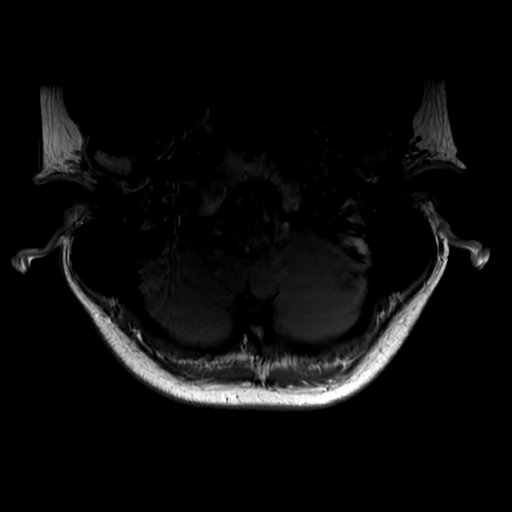

[Series 9: T1 · sagittal · 3.0mm · 0.90mm/px · 1 of 14 slices shown (2 of 4)]
[im 1/14]
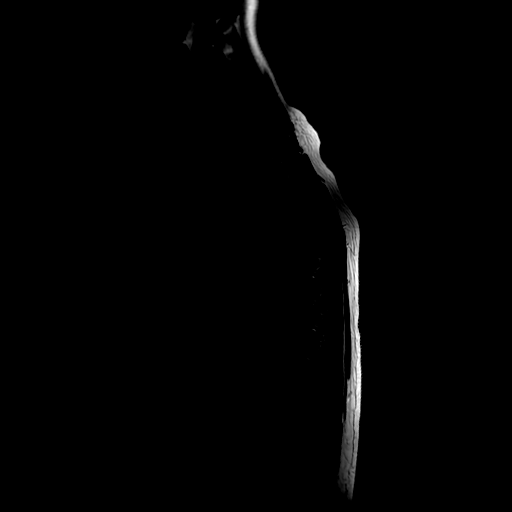

[Series 10: T2 · sagittal · 3.0mm · 0.66mm/px · 1 of 15 slices shown (3 of 4)]
[im 1/15]
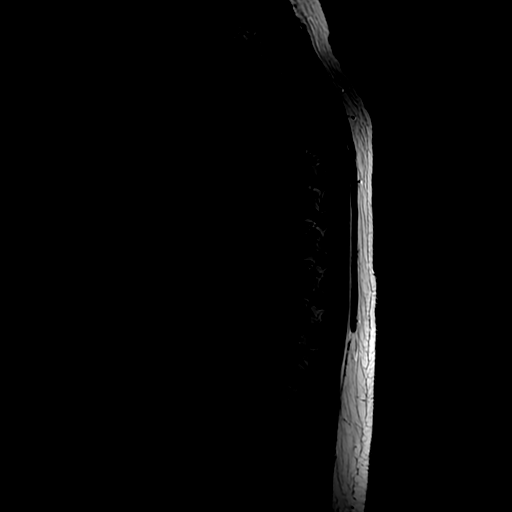

[Series 12: T1 · sagittal · 3.0mm · 0.66mm/px · 1 of 15 slices shown (3 of 4)]
[im 1/15]
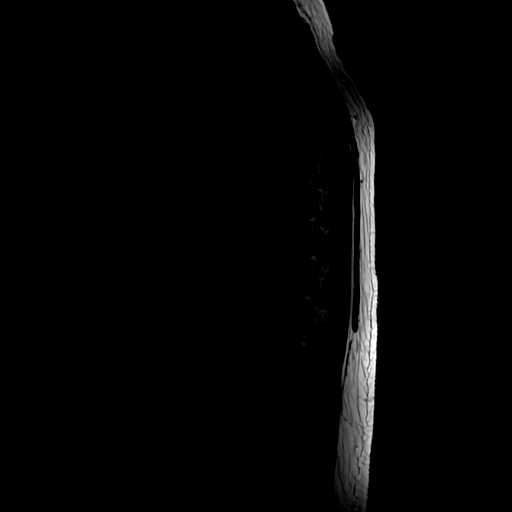

[Series 13: T2 · axial · 4.0mm · 0.39mm/px · z∈[-288,-77]mm · 3 of 35 slices shown (4 of 4)]
[im 1/35]
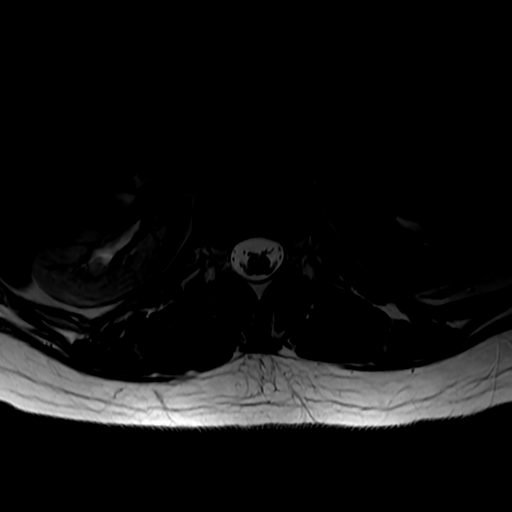
[im 18/35]
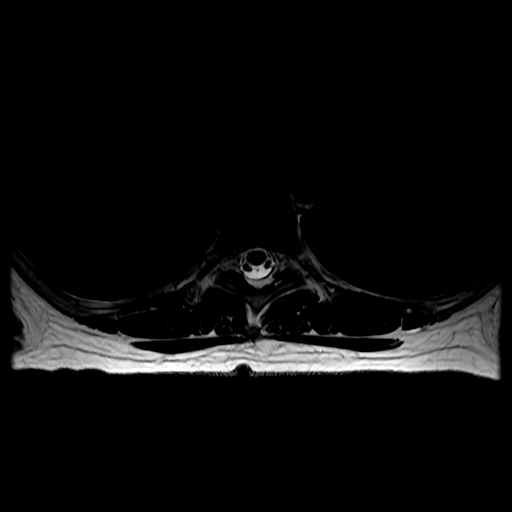
[im 35/35]
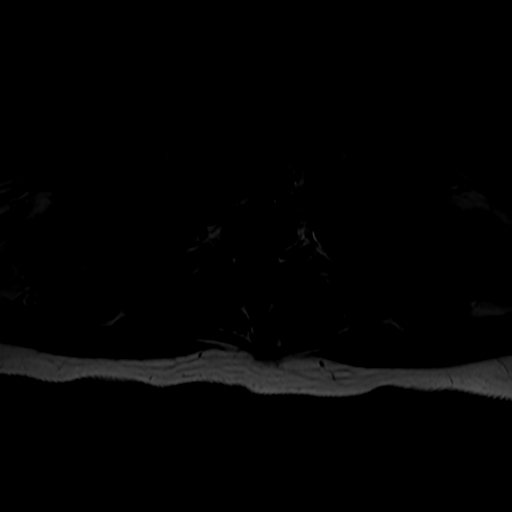

[Series 15: T1 · axial · non-contrast · 4.0mm · 0.39mm/px · z∈[-288,-77]mm · 3 of 35 slices shown (4 of 4)]
[im 1/35]
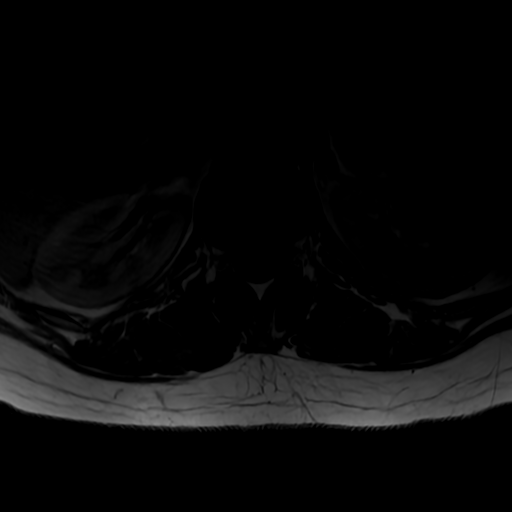
[im 18/35]
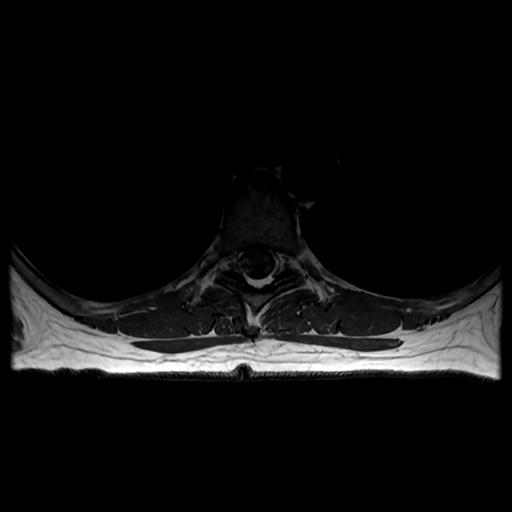
[im 35/35]
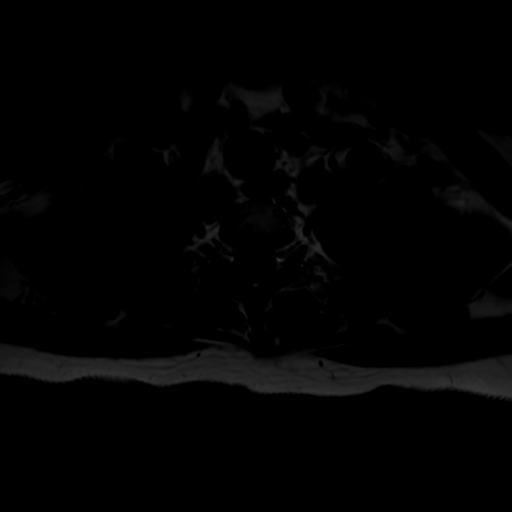

[Series 18: T1 fat-sat post-contrast · sagittal · 3.0mm · 0.66mm/px · 1 of 10 slices shown (1 of 2)]
[im 1/10]
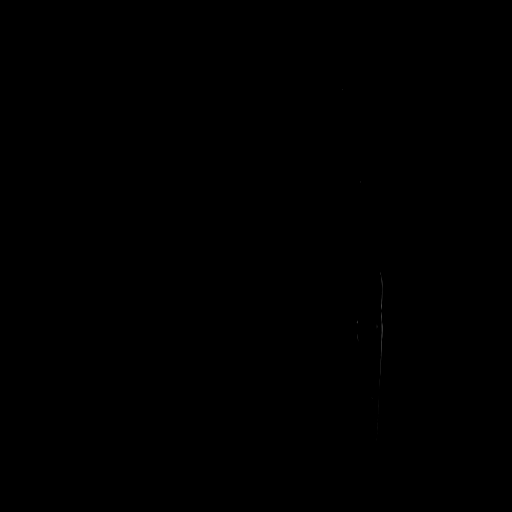

[Series 20: T1 fat-sat post-contrast · sagittal · 3.0mm · 0.66mm/px · 1 of 15 slices shown (2 of 2)]
[im 1/15]
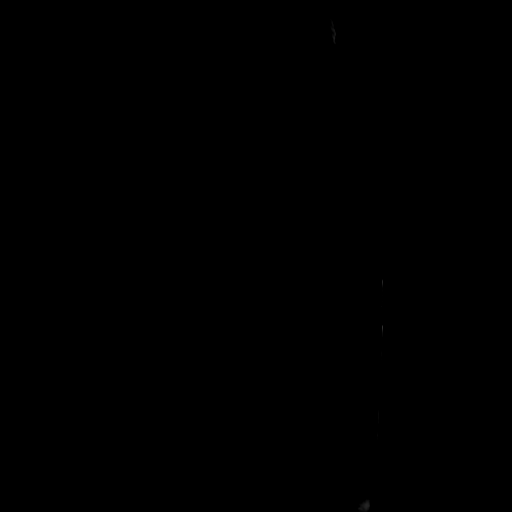

[19 of 48 positions shown; findings below may reference images not displayed]

FINDINGS: MRI CERVICAL SPINE

Alignment: Preserved.

Vertebrae: Vertebral body heights are maintained. No marrow edema.
No suspicious osseous lesion.

Cord: Small nonenhancing foci of T2 hyperintensity at the right and
left lateral aspect of the cord at the C1 level.

Posterior Fossa, vertebral arteries, paraspinal tissues:
Unremarkable. Intracranial findings are discussed on prior dedicated
study.

Disc levels: Intervertebral disc heights and signal are maintained.
There is a disc bulge with endplate osteophytes at C5-C6 without
stenosis.

MRI THORACIC SPINE

Alignment:  Preserved.

Vertebrae: Vertebral body heights are maintained. No marrow edema.
No suspicious osseous lesion.

Cord:  No definite abnormal signal.  No abnormal enhancement.

Paraspinal and other soft tissues: Unremarkable.

Disc levels:

Intervertebral disc heights and signal are maintained. There is no
significant disc herniation or stenosis at any level.
IMPRESSION: Abnormal cord T2 hyperintensity at the C1 level suspicious for
chronic demyelination given MRI brain findings.

No abnormal thoracic cord signal.

No abnormal enhancement to suggest active demyelination.

## 2021-03-31 MED ORDER — KETOROLAC TROMETHAMINE 15 MG/ML IJ SOLN
15.0000 mg | Freq: Once | INTRAMUSCULAR | Status: AC
  Administered 2021-03-31: 22:00:00 15 mg via INTRAVENOUS
  Filled 2021-03-31: qty 1

## 2021-03-31 MED ORDER — SODIUM CHLORIDE 0.9 % IV SOLN
1000.0000 mg | Freq: Every day | INTRAVENOUS | Status: AC
  Administered 2021-03-31 – 2021-04-04 (×5): 1000 mg via INTRAVENOUS
  Filled 2021-03-31 (×5): qty 16

## 2021-03-31 MED ORDER — GADOBUTROL 1 MMOL/ML IV SOLN
7.0000 mL | Freq: Once | INTRAVENOUS | Status: AC | PRN
  Administered 2021-03-31: 09:00:00 7 mL via INTRAVENOUS

## 2021-03-31 NOTE — Progress Notes (Signed)
?  Progress Note ? ? ?Patient: Cindy Summers LJQ:[id number] DOB: [birth year] DOA: 03/30/2021     0 ? ?DOS: the patient was seen and examined on 03/31/2021 ?  ?Brief hospital course: ?This 36 years old female with PMH significant for ADHD, bipolar presented to the ED with acute right visual field defect.  Her symptoms started 3 days ago and she did not seek medical attention hoping that it will be self-limiting in nature.  She denies any personal or family history of autoimmune or demyelinating disease.  Initial MRI shows focus of increased T2 signal, possible concerning for MS. ?Patient is admitted for possible right optic neuritis, to rule out demyelinating disease.  Neurology evaluation,  recommended IV Solu-Medrol 1000 mg daily for 5 days. ? ?Assessment and Plan: ?* Visual field defect ?Patient presented with acute right visual field defect involving inferior visual fields. ?Patient denies any personal or family history of demyelinating disease. ?No preceding trauma nor any history of recent viral syndrome.   ?MRI brain as well as MRI orbits demonstrate no evidence of acute ischemic stroke, finding concerning for MS, optic neuritis. ?MRI C and T-spine finding consistent with chronic demyelinating lesion. ?Neurology recommended Solu-Medrol 1000 mg daily for 5 days. ?Follow-up neurology. ? ?Allergic rhinitis ?Continue loratadine. ? ?Attention deficit hyperactivity disorder (ADHD) ?Patient takes Adderall and guanfacine as an outpatient. ?Hold Adderall in the setting of acute right visual field defect. ?Continue guanfacine. ?  ? ? ? ?Subjective: Patient was seen and examined at bedside.  Overnight events noted. ?Patient reports having problem seeing inferiorly.  She denies any headache, dizziness. ? ?Physical Exam: ?Vitals:  ? 03/31/21 0200 03/31/21 0245 03/31/21 0600 03/31/21 1010  ?BP:  109/75 107/81 125/81  ?Pulse: 84 91 85 93  ?Resp:  15 13 14   ?Temp:      ?TempSrc:      ?SpO2: 97% 100% 100% 99%  ? ?General exam: Appears  comfortable, not in any acute distress. ?Respiratory : CTA bilaterally, no wheezing, no crackles.  Normal respiratory effort ?Cardiovascular : S1-S2 heard, regular rate and rhythm, no murmur. ?Gastrointestinal : Abdomen is  soft, nontender, nondistended, BS+ ?Central nervous system: Alert, oriented x 3.  Blurring of vision in the right inferior nasal and temporal fields. ?Extremities: No edema, no cyanosis, no clubbing ?Psychiatry: Mood, insight, judgment normal ? ?Data Reviewed: ?I have Reviewed nursing notes, Vitals, and Lab results since pt's last encounter. Pertinent lab results CBC, BMP, CT head, MRI ?I have ordered test including CBC, BMP ?I have independently visualized and interpreted imaging MRI C-spine, T-spine which showed chronic demyelinating lesions. ?I have reviewed the last note from neurology,  ?I have discussed pt's care plan and test results with patient and the family.  ? ?Family Communication: Mother at bedside ? ?Disposition: ?Status is: Observation ?The patient remains OBS appropriate and will d/c before 2 midnights. ? Planned Discharge Destination: Home ? ?Time spent: 50 minutes ? ?Author: ? , MD ?03/31/2021 11:14 AM ? ?For on call review [email or web address].  ?

## 2021-03-31 NOTE — Consult Note (Signed)
Neurology Consult H&P ? ?[name] ?MR# [id number] ?03/31/2021 ? ? ?CC: visual deficit ? ?History is obtained from: patient and chart. ? ?HPI: Cindy Summers is a 36 y.o. female PMHx as reviewed below developed visual deficits with associated pain behind her right eye which began 03/27/2021.  She was seen by ophthalmologist who was concerned for optic neuritis and was told to proceed directly to the ED. ? ?She never had this before. ? ?No recent trauma, weakness, numbness, tingling.  No fever, chills, changes in bowel bladder. ? ?ROS: A complete ROS was performed and is negative except as noted in the HPI.  ? ?Past Medical History:  ?Diagnosis Date  ? History of ADHD   ? History of anxiety disorder   ? History of bipolar disorder   ? ?Family History  ?Problem Relation Age of Onset  ? ALS Father   ? Diabetes Maternal Grandmother   ? ?Social History:  reports that she has quit smoking. Her smoking use included cigarettes. She has never used smokeless tobacco. She reports current alcohol use. She reports that she does not use drugs. ? ?Prior to Admission medications   ?Medication Sig Start Date End Date Taking? Authorizing Provider  ?amphetamine-dextroamphetamine (ADDERALL) 30 MG tablet Take 30 mg by mouth 2 (two) times daily. 11/30/20  Yes [provider]  ?guanFACINE (INTUNIV) 2 MG TB24 ER tablet Take 2 mg by mouth daily. 02/25/21  Yes [provider]  ?Lactobacillus (PROBIOTIC ACIDOPHILUS PO) Take 1 tablet by mouth daily. RAE - probiotic and prebiotic.   Yes [provider]  ?loratadine (CLARITIN) 10 MG tablet Take 10 mg by mouth daily.   Yes [provider]  ?Multiple Vitamin (MULTIVITAMIN WITH MINERALS) TABS tablet Take 1 tablet by mouth daily.   Yes [provider]  ?OVER THE COUNTER MEDICATION Take 1 tablet by mouth daily. Daily cleanse   Yes [provider]  ?Propylene Glycol (SYSTANE COMPLETE OP) Place 1 drop into both eyes daily as needed (dry eyes).   Yes  [provider]  ?amphetamine-dextroamphetamine (ADDERALL) 20 MG tablet Take 20 mg by mouth daily. ?Patient not taking: Reported on 03/30/2021 01/23/17   [provider]  ?lamoTRIgine (LAMICTAL) 150 MG tablet Take 150 mg by mouth daily. ?Patient not taking: Reported on 03/30/2021    [provider]  ? ?Exam: ?Current vital signs: ?BP 109/75   Pulse 91   Temp 98.8 ?F (37.1 ?C) (Oral)   Resp 15   SpO2 100%  ? ?Physical Exam  ?Constitutional: Appears well-developed and well-nourished.  ?Psych: Affect appropriate to situation ?Eyes: No scleral injection ?HENT: No OP obstruction. ?Head: Normocephalic.  ?Cardiovascular: Normal rate and regular rhythm.  ?Respiratory: Effort normal, symmetric excursions bilaterally, no audible wheezing. ?GI: Soft.  No distension. There is no tenderness.  ?Skin: WDI ? ?Neuro: ?Mental Status: ?Patient is awake, alert, oriented to person, place, month, year, and situation. ?Patient is able to give a clear and coherent history. ?Speech fluent, intact comprehension and repetition. ?No signs of aphasia or neglect. ?Visual Fields blurry vision in the right altitudinal inferior nasal and temporal fields. Pupils are equal, round, and reactive to light. ?EOMI without ptosis or diplopia.  ?Facial sensation is symmetric to temperature ?Facial movement is symmetric.  ?Hearing is intact to voice. ?Uvula midline and palate elevates symmetrically. ?Shoulder shrug is symmetric. ?Tongue is midline without atrophy or fasciculations.  ?Tone is normal. Bulk is normal. 5/5 strength was present in all four extremities. ?Sensation is symmetric to  light touch and temperature in the arms and legs. ?Deep Tendon Reflexes: 2+ and symmetric in the biceps and patellae. ?Toes are downgoing bilaterally. ?FNF and HKS are intact bilaterally. ?Gait - Deferred ? ?I have reviewed labs in epic and the pertinent results are: ?Glu 114 ? ? ?I have reviewed the images obtained: ?MRI brain showed signal  changes in the right optic nerve sheath and suspect enhancement of the prechiasmatic right optic nerve. Foci of increased T2 signal that are somewhat radially oriented from the ventricles as well as juxtacortical ? ?Assessment: Cindy Summers is a 36 y.o. female PMHx as noted above with blurry vision in the right inferior altitudinal hemifield.  Her visual changes in conjunction with signal changes in the right optic nerve sheath and the prechiasmatic right optic nerve are highly suggestive of optic neuritis.  She will need further imaging to evaluate for demyelinating disease. ? ?Impression:  ?Right optic neuritis ?Rule out demyelinating disease ? ? ?Plan: ?- MRI cervical and thoracic spines with and without contrast. ?- After MRI start Solu-Medrol 1000 mg IV daily for 5 days. ?-She will need antimyelin oligodendrocyte glycoprotein and anti aquaporin 4 antibody tests. ?-Follow-up with outpatient neurology. ? ? ?Electronically signed by:  ?Lynnae Sandhoff, MD ?Page: [id number] ?03/31/2021, 3:41 AM ? ?If 7pm- 7am, please page neurology on call as listed in [place]. ? ?

## 2021-03-31 NOTE — ED Notes (Signed)
Patient transported to MRI 

## 2021-03-31 NOTE — Hospital Course (Addendum)
Cindy Summers is a 36 year-old female with past medical history significant for ADHD, bipolar disorder who presented to [facility] ED on 3/8 with acute right visual field defect.  Her symptoms started 3 days ago and she did not seek medical attention hoping that it will be self-limiting in nature.  She denies any personal or family history of autoimmune or demyelinating disease.  Initial MRI shows focus of increased T2 signal, possible concerning for MS. neurology was consulted.  TRH consulted for admission for possible right optic neuritis, to rule out demyelinating disease.   ?

## 2021-03-31 NOTE — Progress Notes (Signed)
Patient refusing telemetry at this time. MD aware.  ?

## 2021-04-01 DIAGNOSIS — G379 Demyelinating disease of central nervous system, unspecified: Secondary | ICD-10-CM

## 2021-04-01 LAB — MISC LABCORP TEST (SEND OUT): Labcorp test code: 505310

## 2021-04-01 LAB — NEUROMYELITIS OPTICA AUTOAB, IGG: NMO-IgG: <1.5 U/mL (ref 0.0–3.0)

## 2021-04-01 MED ORDER — ACETAMINOPHEN 500 MG PO TABS
1000.0000 mg | ORAL_TABLET | Freq: Four times a day (QID) | ORAL | Status: DC | PRN
  Administered 2021-04-02: 1000 mg via ORAL
  Filled 2021-04-01: qty 2

## 2021-04-01 MED ORDER — KETOROLAC TROMETHAMINE 15 MG/ML IJ SOLN
15.0000 mg | Freq: Three times a day (TID) | INTRAMUSCULAR | Status: DC | PRN
  Administered 2021-04-01: 15 mg via INTRAVENOUS
  Filled 2021-04-01: qty 1

## 2021-04-01 MED ORDER — CHOLECALCIFEROL 10 MCG (400 UNIT) PO TABS
400.0000 [IU] | ORAL_TABLET | Freq: Every day | ORAL | Status: DC
  Administered 2021-04-01 – 2021-04-04 (×4): 400 [IU] via ORAL
  Filled 2021-04-01 (×4): qty 1

## 2021-04-01 MED ORDER — FAMOTIDINE 20 MG PO TABS
20.0000 mg | ORAL_TABLET | Freq: Two times a day (BID) | ORAL | Status: DC
Start: 2021-04-01 — End: 2021-04-04
  Administered 2021-04-01 – 2021-04-04 (×7): 20 mg via ORAL
  Filled 2021-04-01 (×7): qty 1

## 2021-04-01 MED ORDER — CALCIUM CARBONATE 1250 (500 CA) MG PO TABS
1.0000 | ORAL_TABLET | Freq: Every day | ORAL | Status: DC
  Administered 2021-04-01 – 2021-04-04 (×4): 500 mg via ORAL
  Filled 2021-04-01 (×4): qty 1

## 2021-04-01 NOTE — Progress Notes (Signed)
?  Progress Note ? ? ?Patient: Cindy Summers DOB: 1985/09/20 DOA: 03/30/2021     1 ? ?DOS: the patient was seen and examined on 04/01/2021 ?  ?Brief hospital course: ?This 36 years old female with PMH significant for ADHD, bipolar presented to the ED with acute right visual field defect.  Her symptoms started 3 days ago and she did not seek medical attention hoping that it will be self-limiting in nature.  She denies any personal or family history of autoimmune or demyelinating disease.  Initial MRI shows focus of increased T2 signal, possible concerning for MS. ?Patient is admitted for possible right optic neuritis, to rule out demyelinating disease.  Neurology evaluation,  recommended IV Solu-Medrol 1000 mg daily for 5 days. MRI finding consistent with  new MS with optic neuritis. ? ?Assessment and Plan: ?* Visual field defect ?Patient presented with acute right visual field defect involving inferior visual fields. ?Patient denies any personal or family history of demyelinating disease. ?No preceding trauma nor any history of recent viral syndrome.   ?MRI brain as well as MRI orbits demonstrate no evidence of acute ischemic stroke, finding concerning for New MS diagnosis, optic neuritis. ?MRI C and T-spine finding consistent with chronic demyelinating lesion. ?Neurology recommended high-dose Solu-Medrol 1000 mg daily for 5 days. ?Add Pepcid, calcium and vitamin D. ?Patient reports slight improvement in vision. ?Follow-up outpatient neurology in [place] [facility]. ? ?Allergic rhinitis ?Continue loratadine. ? ?Attention deficit hyperactivity disorder (ADHD) ?Patient takes Adderall and guanfacine as an outpatient. ?Hold Adderall in the setting of acute right visual field defect. ?Continue guanfacine. ?  ? ? ? ?Subjective: Patient was seen and examined at bedside.  Overnight events noted. ?Patient reports slight improvement in her inferior vision, denies any headache. ? ?Physical Exam: ?Vitals:  ? 03/31/21 2319 04/01/21  0316 04/01/21 0724 04/01/21 1226  ?BP: 117/82 120/79 122/86 (!) 143/89  ?Pulse: 75 80 100 (!) 110  ?Resp: 16 16 16 16   ?Temp: 98.7 ?F (37.1 ?C) 98.5 ?F (36.9 ?C) 99.3 ?F (37.4 ?C) 98.9 ?F (37.2 ?C)  ?TempSrc: Oral Oral Oral Oral  ?SpO2: 98% 98% 100% 100%  ?Weight:      ?Height:      ? ?General exam: Appears comfortable, not in any acute distress. ?Respiratory : CTA bilaterally, no wheezing, no crackles.  Normal respiratory effort ?Cardiovascular : S1-S2 heard, regular rate and rhythm, no murmur. ?Gastrointestinal : Abdomen is  soft, non tender, non distended, BS+ ?Central nervous system: Alert, oriented x 3.  Blurring of vision in the right inferior nasal and temporal fields. ?Extremities: No edema, no cyanosis, no clubbing ?Psychiatry: Mood, insight, judgment normal ? ?Data Reviewed: ?I have Reviewed nursing notes, Vitals, and Lab results since pt's last encounter. Pertinent lab results CBC, BMP ?I have ordered test including CBC, BMP ?I have reviewed the last note from neurology,  ?I have discussed pt's care plan and test results with patient and the mother.  ? ?Family Communication: Mother at bedside ? ?Disposition: ? ?Status is: Inpatient ?Remains inpatient appropriate because: Admitted for new MS diagnosis with optic neuritis needs 5 days of high-dose IV steroids. ? ? Planned Discharge Destination: Home ? ?Time spent: 35 minutes ? ?Author: ?Shawna Clamp, MD ?04/01/2021 2:06 PM ? ?For on call review [email or web address].  ?

## 2021-04-01 NOTE — Progress Notes (Addendum)
Neurology Progress Note ? ? ?S:// ?Patient sitting up in bed on phone. Mom is at bedside. Patient states vision in right eye is improved, she make out objects in peripheral vision, slight blurriness. No new events noted. Dr. Wilford Summers at bedside, showed patient and Mom MRI images and answered all questions, both voiced understanding ? ? ?O:// ?Current vital signs: ?BP 122/86 (BP Location: Left Arm)   Pulse 100   Temp 99.3 ?F (37.4 ?C) (Oral)   Resp 16   Ht 5\' 7"  (1.702 m)   Wt 70 kg   SpO2 100%   BMI 24.17 kg/m?  ?Vital signs in last 24 hours: ?Temp:  [98.1 ?F (36.7 ?C)-99.3 ?F (37.4 ?C)] 99.3 ?F (37.4 ?C) (03/10 0724) ?Pulse Rate:  [75-100] 100 (03/10 0724) ?Resp:  [14-16] 16 (03/10 0724) ?BP: (103-122)/(70-86) 122/86 (03/10 0724) ?SpO2:  [97 %-100 %] 100 % (03/10 0724) ?Weight:  [70 kg] 70 kg (03/09 2013) ?GENERAL: Awake, alert in NAD ?HEENT: - Normocephalic and atraumatic, dry mm ?LUNGS - Clear to auscultation bilaterally with no wheezes ?CV - S1S2 RRR, no m/r/g, equal pulses bilaterally. ?ABDOMEN - Soft, nontender, nondistended with normoactive BS ?Ext: warm, well perfused, intact peripheral pulses, no  edema ?NEURO:  ?Mental Status: AA&Ox3  ?Language: speech is clear.  Naming, repetition, fluency, and comprehension intact. ?Cranial Nerves: PERRL mm/brisk. EOMI, visual fields full, no facial asymmetry, facial sensation intact, hearing intact, tongue/uvula/soft palate midline, normal sternocleidomastoid and trapezius muscle strength. No evidence of tongue atrophy or fibrillations ?Motor: 5/5 in all 4 extremities ?Tone: is normal and bulk is normal ?Sensation- Intact to light touch bilaterally ?Coordination: FTN intact bilaterally, no ataxia in BLE. ?Gait- deferred ? ? ?Medications ? ?Current Facility-Administered Medications:  ?  acetaminophen (TYLENOL) tablet 650 mg, 650 mg, Oral, Q6H PRN, 650 mg at 04/01/21 0858 **OR** acetaminophen (TYLENOL) suppository 650 mg, 650 mg, Rectal, Q6H PRN, Summers, Cindy B,  DO ?  guanFACINE (INTUNIV) ER tablet 2 mg, 2 mg, Oral, Daily, Summers, Cindy B, DO, 2 mg at 04/01/21 0854 ?  lactated ringers infusion, , Intravenous, Continuous, Summers, Cindy B, DO, Stopped at 03/31/21 0750 ?  loratadine (CLARITIN) tablet 10 mg, 10 mg, Oral, Daily, Summers, Cindy B, DO, 10 mg at 04/01/21 0854 ?  methylPREDNISolone sodium succinate (SOLU-MEDROL) 1,000 mg in sodium chloride 0.9 % 50 mL IVPB, 1,000 mg, Intravenous, Daily, 06/01/21, MD, Last Rate: 66 mL/hr at 04/01/21 0904, 1,000 mg at 04/01/21 0904 ?Labs ?CBC ?   ?Component Value Date/Time  ? WBC 8.8 03/31/2021 0333  ? RBC 4.61 03/31/2021 0333  ? HGB 13.5 03/31/2021 0333  ? HCT 41.0 03/31/2021 0333  ? PLT 282 03/31/2021 0333  ? MCV 88.9 03/31/2021 0333  ? MCH 29.3 03/31/2021 0333  ? MCHC 32.9 03/31/2021 0333  ? RDW 12.1 03/31/2021 0333  ? LYMPHSABS 3.1 03/31/2021 0333  ? MONOABS 0.7 03/31/2021 0333  ? EOSABS 0.2 03/31/2021 0333  ? BASOSABS 0.1 03/31/2021 0333  ? ? ?CMP  ?   ?Component Value Date/Time  ? NA 139 03/31/2021 0333  ? K 3.5 03/31/2021 0333  ? CL 107 03/31/2021 0333  ? CO2 23 03/31/2021 0333  ? GLUCOSE 86 03/31/2021 0333  ? BUN 12 03/31/2021 0333  ? CREATININE 0.63 03/31/2021 0333  ? CALCIUM 8.9 03/31/2021 0333  ? PROT 6.3 (L) 03/31/2021 0333  ? ALBUMIN 3.7 03/31/2021 0333  ? AST 16 03/31/2021 0333  ? ALT 12 03/31/2021 0333  ? ALKPHOS 53 03/31/2021 0333  ? BILITOT 0.7  03/31/2021 0333  ? GFRNONAA >60 03/31/2021 0333  ? ? ?glycosylated hemoglobin ? ?Lipid Panel  ?No results found for: CHOL, TRIG, HDL, CHOLHDL, VLDL, LDLCALC, LDLDIRECT ? ? ?Imaging ?I have reviewed images in epic and the results pertinent to this consultation are: ? ?MRI examination of the brain 3/8: ?No restricted diffusion to suggest acute or subacute infarct ? ?MRI Orbits  3/8: ?1. Increased T2 hyperintense signal in the right optic nerve sheath compared to the left, without definite increased T2 signal within ?the right optic nerve, and with possible  enhancement of the prechiasmatic right optic nerve, concerning but not conclusive for ?optic neuritis. ?2. Foci of increased T2 signal that are somewhat radially oriented from the ventricles as well as juxtacortical, which are nonspecific but can be seen in the setting of demyelinating disease, such as multiple sclerosis. No contrast enhancement to suggest active demyelination. ? ?MRI C-spine 3/9:  ?Abnormal cord T2 hyperintensity at the C1 level suspicious for chronic demyelination given MRI brain findings. ? ?MRI T spine 3/9: ?No abnormal thoracic cord signal. ? No abnormal enhancement to suggest active demyelination ? ?Assessment:  ?Cindy Summers is a 36 y.o. female PMHx as noted above with blurry vision in the right inferior altitudinal hemifield.  Her visual changes in conjunction with signal changes in the right optic nerve sheath and the prechiasmatic right optic nerve are highly suggestive of optic neuritis.  She will need further imaging to evaluate for demyelinating disease. ?  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 ?Recommendations: ?- continue high dose steroids for a total of 5 days  ?- Add pepcid 20mg  BID while on steroids  ?- Add calcium and Vit D supplementation daily  ?- Neurology will continue to follow patient ?- will need outpatient follow up with neurology/ MS clinic  ? ? , DNP, ACNPC-AG  ? ?Attending addendum ?Patient seen and examined ?Agree with examination above ?No APD today. ?No focal cranial nerve, motor or sensory deficits ?Patient is awake alert oriented x3 ?Mother and patient at multiple questions about her current clinical condition.  Answered them to the best my ability. ?She will need HIDA steroids for total of 5 days along with PPI and calcium/vitamin D supplementation. ?She needs to follow-up with Dr. Gevena Summers at [facility] neurology on discharge. ?Was discussed with Cindy Summers at bedside ? ?-- ?Cindy Muss, MD ?Neurologist ?[facility] ?Pager: [phone] ? ?  ?

## 2021-04-02 ENCOUNTER — Encounter (HOSPITAL_COMMUNITY): Payer: Self-pay | Admitting: Internal Medicine

## 2021-04-02 NOTE — Progress Notes (Signed)
? ? ? ?Progress Note  ? ? ?Cindy Summers  [id number] DOB: Jun 15, 1985  DOA: 03/30/2021 ?PCP: Judy Pimple, MD  ? ? ? ? ?Brief Narrative:  ? ? ?Medical records reviewed and are as summarized below: ? ?Cindy Summers is a 36 y.o. female with PMH significant for ADHD, bipolar, who presented to the ED with acute right visual field defect.  Her symptoms started about 3 days prior to admission.  She did not seek medical attention earlier because she was hoping it would resolve on its own. ? ?She was admitted to the hospital for acute right optic neuritis and new multiple sclerosis diagnosis. ? ? ? ?Assessment/Plan:  ? ?Principal Problem: ?  Visual field defect ?Active Problems: ?  Attention deficit hyperactivity disorder (ADHD) ?  Allergic rhinitis ? ? ?Body mass index is 24.17 kg/m?. ? ? ?Multiple sclerosis, right optic neuritis: Continue high-dose IV steroids.  Continue famotidine for GI prophylaxis.  Continue calcium and vitamin D supplement.  Outpatient follow-up with neurologist, Dr. Despina Arias at [facility] neurology recommended. ? ?ADHD: Continue guanfacine. Adderall has been held because of right visual field deficit. ? ? ? ?Diet Order   ? ?       ?  Diet regular Room service appropriate? Yes; Fluid consistency: Thin  Diet effective now       ?  ? ?  ?  ? ?  ? ? ? ? ? ? ? ? ? ?Consultants: ?Neurologist ? ?Procedures: ?None ? ? ? ?Medications:  ? ? calcium carbonate  1 tablet Oral Q breakfast  ? cholecalciferol  400 Units Oral Daily  ? famotidine  20 mg Oral BID  ? guanFACINE  2 mg Oral Daily  ? loratadine  10 mg Oral Daily  ? ?Continuous Infusions: ? methylPREDNISolone (SOLU-MEDROL) injection 1,000 mg (04/02/21 0801)  ? ? ? ?Anti-infectives (From admission, onward)  ? ? None  ? ?  ? ? ? ? ? ? ? ? ? ?Family Communication/Anticipated D/C date and plan/Code Status  ? ?DVT prophylaxis: SCDs Start: 03/30/21 2052 ? ? ?  Code Status: Full Code ? ?Family Communication: None ?Disposition Plan: Plan to discharge home in 2  days ? ? ?Status is: Inpatient ?Remains inpatient appropriate because: On IV steroids ? ? ? ? ? ? ?Subjective:  ? ?Interval events noted.  Vision in right eye is improving. ? ?Objective:  ? ? ?Vitals:  ? 04/01/21 1934 04/01/21 2359 04/02/21 0343 04/02/21 0749  ?BP: 125/85 118/88 (!) 134/93 (!) 124/94  ?Pulse: 94 75 88 85  ?Resp: 16 17 18 18   ?Temp: 98.3 ?F (36.8 ?C) 98.5 ?F (36.9 ?C) 98.5 ?F (36.9 ?C) 98 ?F (36.7 ?C)  ?TempSrc: Oral Oral Oral Oral  ?SpO2: 100% 100%  95%  ?Weight:      ?Height:      ? ?No data found. ? ? ?Intake/Output Summary (Last 24 hours) at 04/02/2021 1536 ?Last data filed at 04/02/2021 0300 ?Gross per 24 hour  ?Intake 818.25 ml  ?Output --  ?Net 818.25 ml  ? ?Filed Weights  ? 03/31/21 2013  ?Weight: 70 kg  ? ? ?Exam: ? ?GEN: NAD ?SKIN: No rash ?EYES: EOMI ?ENT: MMM ?CV: RRR ?PULM: CTA B ?ABD: soft, ND, NT, +BS ?CNS: AAO x 3, non focal ?EXT: No edema or tenderness ? ? ? ?  ? ? ?Data Reviewed:  ? ?I have personally reviewed following labs and imaging studies: ? ?Labs: ?Labs show the following:  ? ?Basic Metabolic Panel: ?  Recent Labs  ?Lab 03/30/21 ?1548 03/31/21 ?0333  ?NA 139 139  ?K 4.0 3.5  ?CL 106 107  ?CO2 24 23  ?GLUCOSE 114* 86  ?BUN 13 12  ?CREATININE 0.71 0.63  ?CALCIUM 9.3 8.9  ?MG  --  1.9  ? ?GFR ?Estimated Creatinine Clearance: 96.4 mL/min (by C-G formula based on SCr of 0.63 mg/dL). ?Liver Function Tests: ?Recent Labs  ?Lab 03/31/21 ?0333  ?AST 16  ?ALT 12  ?ALKPHOS 53  ?BILITOT 0.7  ?PROT 6.3*  ?ALBUMIN 3.7  ? ?No results for input(s): LIPASE, AMYLASE in the last 168 hours. ?No results for input(s): AMMONIA in the last 168 hours. ?Coagulation profile ?No results for input(s): INR, PROTIME in the last 168 hours. ? ?CBC: ?Recent Labs  ?Lab 03/30/21 ?1548 03/31/21 ?0333  ?WBC 9.7 8.8  ?NEUTROABS 7.1 4.6  ?HGB 14.3 13.5  ?HCT 41.9 41.0  ?MCV 87.8 88.9  ?PLT 310 282  ? ?Cardiac Enzymes: ?No results for input(s): CKTOTAL, CKMB, CKMBINDEX, TROPONINI in the last 168 hours. ?BNP (last 3  results) ?No results for input(s): PROBNP in the last 8760 hours. ?CBG: ?No results for input(s): GLUCAP in the last 168 hours. ?D-Dimer: ?No results for input(s): DDIMER in the last 72 hours. ?Hgb A1c: ?No results for input(s): HGBA1C in the last 72 hours. ?Lipid Profile: ?No results for input(s): CHOL, HDL, LDLCALC, TRIG, CHOLHDL, LDLDIRECT in the last 72 hours. ?Thyroid function studies: ?No results for input(s): TSH, T4TOTAL, T3FREE, THYROIDAB in the last 72 hours. ? ?Invalid input(s): FREET3 ?Anemia work up: ?No results for input(s): VITAMINB12, FOLATE, FERRITIN, TIBC, IRON, RETICCTPCT in the last 72 hours. ?Sepsis Labs: ?Recent Labs  ?Lab 03/30/21 ?1548 03/31/21 ?0333  ?WBC 9.7 8.8  ? ? ?Microbiology ?Recent Results (from the past 240 hour(s))  ?Resp Panel by RT-PCR (Flu A&B, Covid) Nasopharyngeal Swab     Status: None  ? Collection Time: 03/30/21  8:49 PM  ? Specimen: Nasopharyngeal Swab; Nasopharyngeal(NP) swabs in vial transport medium  ?Result Value Ref Range Status  ? SARS Coronavirus 2 by RT PCR NEGATIVE NEGATIVE Final  ?  Comment: (NOTE) ?SARS-CoV-2 target nucleic acids are NOT DETECTED. ? ?The SARS-CoV-2 RNA is generally detectable in upper respiratory ?specimens during the acute phase of infection. The lowest ?concentration of SARS-CoV-2 viral copies this assay can detect is ?138 copies/mL. A negative result does not preclude SARS-Cov-2 ?infection and should not be used as the sole basis for treatment or ?other patient management decisions. A negative result may occur with  ?improper specimen collection/handling, submission of specimen other ?than nasopharyngeal swab, presence of viral mutation(s) within the ?areas targeted by this assay, and inadequate number of viral ?copies(<138 copies/mL). A negative result must be combined with ?clinical observations, patient history, and epidemiological ?information. The expected result is Negative. ? ?Fact Sheet for Patients:   ?BloggerCourse.com ? ?Fact Sheet for Healthcare Providers:  ?SeriousBroker.it ? ?This test is no t yet approved or cleared by the [place] FDA and  ?has been authorized for detection and/or diagnosis of SARS-CoV-2 by ?FDA under an Emergency Use Authorization (EUA). This EUA will remain  ?in effect (meaning this test can be used) for the duration of the ?COVID-19 declaration under Section 564(b)(1) of the Act, 21 ?U.S.C.section 360bbb-3(b)(1), unless the authorization is terminated  ?or revoked sooner.  ? ? ?  ? Influenza A by PCR NEGATIVE NEGATIVE Final  ? Influenza B by PCR NEGATIVE NEGATIVE Final  ?  Comment: (NOTE) ?The Xpert Xpress SARS-CoV-2/FLU/RSV plus assay  is intended as an aid ?in the diagnosis of influenza from Nasopharyngeal swab specimens and ?should not be used as a sole basis for treatment. Nasal washings and ?aspirates are unacceptable for Xpert Xpress SARS-CoV-2/FLU/RSV ?testing. ? ?Fact Sheet for Patients: ?BloggerCourse.com ? ?Fact Sheet for Healthcare Providers: ?SeriousBroker.it ? ?This test is not yet approved or cleared by the [place] FDA and ?has been authorized for detection and/or diagnosis of SARS-CoV-2 by ?FDA under an Emergency Use Authorization (EUA). This EUA will remain ?in effect (meaning this test can be used) for the duration of the ?COVID-19 declaration under Section 564(b)(1) of the Act, 21 U.S.C. ?section 360bbb-3(b)(1), unless the authorization is terminated or ?revoked. ? ?Performed at [facility], [address], [place], [place] ?[address] ?  ? ? ?Procedures and diagnostic studies: ? ?No results found. ? ? ? ? ? ? ? ? ? ? ? ? LOS: 2 days  ? ?Kyaira Trantham  ?[facility]  ? ?Pager on [email or web address]. If 7PM-7AM, please contact night-coverage at [email or web address] ? ? ? ? ?04/02/2021, 3:36 PM  ? ? ? ? ? ? ? ? ? ?

## 2021-04-03 MED ORDER — AMPHETAMINE-DEXTROAMPHETAMINE 10 MG PO TABS
30.0000 mg | ORAL_TABLET | Freq: Two times a day (BID) | ORAL | Status: DC
Start: 2021-04-03 — End: 2021-04-04
  Administered 2021-04-03 – 2021-04-04 (×3): 30 mg via ORAL
  Filled 2021-04-03 (×3): qty 3

## 2021-04-03 NOTE — Progress Notes (Signed)
Neurology Progress Note ? ? ?S:// ?Patient is in bed in NAD. Awake and alert. No family at bedside. Patient states her vision continues to improve, however things are still a little blurry in the inferior outer quadrant and at times there may be a gray horizontal strip through the middle of her vision. Mom was on speaker phone during exam. NO new events overnight.  ? ? ?O:// ?Current vital signs: ?BP 118/83 (BP Location: Right Arm)   Pulse 92   Temp 98.6 ?F (37 ?C) (Oral)   Resp 20   Ht 5\' 7"  (1.702 m)   Wt 70 kg   SpO2 100%   BMI 24.17 kg/m?  ?Vital signs in last 24 hours: ?Temp:  [98.2 ?F (36.8 ?C)-98.6 ?F (37 ?C)] 98.6 ?F (37 ?C) (03/12 2633) ?Pulse Rate:  [92-100] 92 (03/12 0819) ?Resp:  [17-20] 20 (03/12 0819) ?BP: (113-127)/(83-87) 118/83 (03/12 3545) ?SpO2:  [97 %-100 %] 100 % (03/12 0819) ? ?GENERAL: Awake, alert in NAD ?HEENT: - Normocephalic and atraumatic, dry mm ?LUNGS - Clear to auscultation bilaterally with no wheezes ?CV - S1S2 RRR, no m/r/g, equal pulses bilaterally. ?ABDOMEN - Soft, nontender, nondistended with normoactive BS ?Ext: warm, well perfused, intact peripheral pulses, no  edema ?NEURO:  ?Mental Status: AA&Ox3.  Somewhat fast talking today-denied feelings of mania. ?Language: speech is clear.  Naming, repetition, fluency, and comprehension intact. ?Cranial Nerves: PERRL mm/brisk. EOMI, visual fields full, no facial asymmetry, facial sensation intact, hearing intact, tongue/uvula/soft palate midline, normal sternocleidomastoid and trapezius muscle strength. No evidence of tongue atrophy or fibrillations ?Motor: 5/5 in all 4 extremities ?Tone: is normal and bulk is normal ?Sensation- Intact to light touch bilaterally ?Coordination: FTN intact bilaterally, no ataxia in BLE. ?Gait- deferred ? ? ?Medications ? ?Current Facility-Administered Medications:  ?  acetaminophen (TYLENOL) tablet 1,000 mg, 1,000 mg, Oral, Q6H PRN, Gevena Mart A, NP, 1,000 mg at 04/02/21 1919 ?  calcium carbonate  (OS-CAL - dosed in mg of elemental calcium) tablet 500 mg of elemental calcium, 1 tablet, Oral, Q breakfast, Gevena Mart A, NP, 500 mg of elemental calcium at 04/03/21 0934 ?  cholecalciferol (VITAMIN D3) tablet 400 Units, 400 Units, Oral, Daily, Gevena Mart A, NP, 400 Units at 04/03/21 6256 ?  famotidine (PEPCID) tablet 20 mg, 20 mg, Oral, BID, Gevena Mart A, NP, 20 mg at 04/03/21 0934 ?  guanFACINE (INTUNIV) ER tablet 2 mg, 2 mg, Oral, Daily, Howerter, Justin B, DO, 2 mg at 04/03/21 0934 ?  ketorolac (TORADOL) 15 MG/ML injection 15 mg, 15 mg, Intravenous, Q8H PRN, Cipriano Bunker, MD, 15 mg at 04/01/21 1122 ?  loratadine (CLARITIN) tablet 10 mg, 10 mg, Oral, Daily, Howerter, Justin B, DO, 10 mg at 04/03/21 0934 ?  methylPREDNISolone sodium succinate (SOLU-MEDROL) 1,000 mg in sodium chloride 0.9 % 50 mL IVPB, 1,000 mg, Intravenous, Daily, Milon Dikes, MD, Stopped at 04/02/21 0902 ?Labs ?CBC ?   ?Component Value Date/Time  ? WBC 8.8 03/31/2021 0333  ? RBC 4.61 03/31/2021 0333  ? HGB 13.5 03/31/2021 0333  ? HCT 41.0 03/31/2021 0333  ? PLT 282 03/31/2021 0333  ? MCV 88.9 03/31/2021 0333  ? MCH 29.3 03/31/2021 0333  ? MCHC 32.9 03/31/2021 0333  ? RDW 12.1 03/31/2021 0333  ? LYMPHSABS 3.1 03/31/2021 0333  ? MONOABS 0.7 03/31/2021 0333  ? EOSABS 0.2 03/31/2021 0333  ? BASOSABS 0.1 03/31/2021 0333  ? ? ?CMP  ?   ?Component Value Date/Time  ? NA 139 03/31/2021 0333  ? K  3.5 03/31/2021 0333  ? CL 107 03/31/2021 0333  ? CO2 23 03/31/2021 0333  ? GLUCOSE 86 03/31/2021 0333  ? BUN 12 03/31/2021 0333  ? CREATININE 0.63 03/31/2021 0333  ? CALCIUM 8.9 03/31/2021 0333  ? PROT 6.3 (L) 03/31/2021 0333  ? ALBUMIN 3.7 03/31/2021 0333  ? AST 16 03/31/2021 0333  ? ALT 12 03/31/2021 0333  ? ALKPHOS 53 03/31/2021 0333  ? BILITOT 0.7 03/31/2021 0333  ? GFRNONAA >60 03/31/2021 0333  ?Anti-MOG antibody negative ?NMO antibody negative ? ?Imaging ?I have reviewed images in epic and the results pertinent to this consultation are: ? ?MRI  examination of the brain 3/8: ?No restricted diffusion to suggest acute or subacute infarct ? ?MRI Orbits  3/8: ?1. Increased T2 hyperintense signal in the right optic nerve sheath compared to the left, without definite increased T2 signal within ?the right optic nerve, and with possible enhancement of the prechiasmatic right optic nerve, concerning but not conclusive for ?optic neuritis. ?2. Foci of increased T2 signal that are somewhat radially oriented from the ventricles as well as juxtacortical, which are nonspecific but can be seen in the setting of demyelinating disease, such as multiple sclerosis. No contrast enhancement to suggest active demyelination. ? ?MRI C-spine 3/9:  ?Abnormal cord T2 hyperintensity at the C1 level suspicious for chronic demyelination given MRI brain findings. ? ?MRI T spine 3/9: ?No abnormal thoracic cord signal. ? No abnormal enhancement to suggest active demyelination ? ?Assessment:  ?Cindy Summers is a 36 y.o. female with past medical history of ADHD and  bipolar who presents with blurry vision in the right inferior altitudinal hemifield.  Her visual changes in conjunction with signal changes in the right optic nerve sheath and the prechiasmatic right optic nerve are highly suggestive of optic neuritis.   ?Anti-Mog and NMO antibodies are negative. ?  ?Impression: ?Right optic neuritis ?Brain MRI suggestive of demyelinating lesions ? ? ?Recommendations: ?- continue high dose steroids for a total of 5 days, day 4/5 ?- Continue pepcid 20mg  BID while on steroids  ?- Continue calcium and Vit D supplementation daily  ?- will need outpatient follow up with Dr Arlice Colt at [place] neurology on discharge ?- Neurology will sign off. Please call with any questions or concerns ? ?Beulah Gandy DNP, ACNPC-AG  ? ?Attending addendum ?Patient seen and examined ?Imaging personally reviewed ?Agree with the history and physical documented above ?Patient had a lot of questions regarding  medications-answered them to the best of my ability and have recommended to have continuing conversations with outpatient specialist. ?Also had questions on concurrent use of marijuana and psychedelics.  I told her that not enough evidence exists for me to recommend using illicit substances.  I have encouraged her to refrain from using illicit substances. ?She also had questions about vaping-I told her that tobacco use has been shown to be detrimental in patients with MS and she should stay away from it. ?She will be following up with Dr. Felecia Shelling.  I have provided her with the number for [facility] neurology and she will be given a referral to [facility] neurology by the hospitalist on discharge.  Please make sure to put ambulatory referral to [facility] neurology. ? ?-- ?Amie Portland, MD ?Neurologist ?[facility] ?Pager: [phone] ? ? ?

## 2021-04-03 NOTE — Progress Notes (Signed)
? ? ? ?Progress Note  ? ? ?LEIAH HOURANI  [id number] DOB: Jul 29, 1985  DOA: 03/30/2021 ?PCP: Abner Greenspan, MD  ? ? ? ? ?Brief Narrative:  ? ? ?Medical records reviewed and are as summarized below: ? ?SORREL HARTSTEIN is a 36 y.o. female with PMH significant for ADHD, bipolar, who presented to the ED with acute right visual field defect.  Her symptoms started about 3 days prior to admission.  She did not seek medical attention earlier because she was hoping it would resolve on its own. ? ?She was admitted to the hospital for acute right optic neuritis and new multiple sclerosis diagnosis. ? ? ? ?Assessment/Plan:  ? ?Principal Problem: ?  Visual field defect ?Active Problems: ?  Attention deficit hyperactivity disorder (ADHD) ?  Allergic rhinitis ? ? ?Body mass index is 24.17 kg/m?. ? ? ?Multiple sclerosis, right optic neuritis: Continue high-dose IV Solu-Medrol.  Continue famotidine for GI prophylaxis. Continue calcium and vitamin D supplement.  Outpatient follow-up with neurologist, Dr. Arlice Colt at [facility] neurology recommended. ? ?ADHD: Continue guanfacine.  Okay to continue Adderall.  Discussed with Dr. Rory Percy, neurologist. ? ? ?Diet Order   ? ?       ?  Diet regular Room service appropriate? Yes; Fluid consistency: Thin  Diet effective now       ?  ? ?  ?  ? ?  ? ? ? ? ? ? ? ? ? ?Consultants: ?Neurologist ? ?Procedures: ?None ? ? ? ?Medications:  ? ? amphetamine-dextroamphetamine  30 mg Oral BID  ? calcium carbonate  1 tablet Oral Q breakfast  ? cholecalciferol  400 Units Oral Daily  ? famotidine  20 mg Oral BID  ? guanFACINE  2 mg Oral Daily  ? loratadine  10 mg Oral Daily  ? ?Continuous Infusions: ? methylPREDNISolone (SOLU-MEDROL) injection 1,000 mg (04/03/21 1217)  ? ? ? ?Anti-infectives (From admission, onward)  ? ? None  ? ?  ? ? ? ? ? ? ? ? ? ?Family Communication/Anticipated D/C date and plan/Code Status  ? ?DVT prophylaxis: SCDs Start: 03/30/21 2052 ? ? ?  Code Status: Full Code ? ?Family Communication:  None ?Disposition Plan: Plan to discharge home tomorrow ? ? ?Status is: Inpatient ?Remains inpatient appropriate because: On IV steroids ? ? ? ? ? ? ?Subjective:  ? ?Vision in the right eye is not completely back to normal.  She still has trouble seeing from the lateral aspect of the right eye.  She describes it as "a blob" ? ?Objective:  ? ? ?Vitals:  ? 04/02/21 2315 04/03/21 0337 04/03/21 0819 04/03/21 1209  ?BP: 113/87 113/87 118/83 131/90  ?Pulse: 92 100 92 98  ?Resp: 17 19 20    ?Temp: 98.2 ?F (36.8 ?C) 98.4 ?F (36.9 ?C) 98.6 ?F (37 ?C) 98.9 ?F (37.2 ?C)  ?TempSrc: Oral Oral Oral Oral  ?SpO2: 97% 98% 100% 97%  ?Weight:      ?Height:      ? ?No data found. ? ? ?Intake/Output Summary (Last 24 hours) at 04/03/2021 1352 ?Last data filed at 04/03/2021 0300 ?Gross per 24 hour  ?Intake 66 ml  ?Output --  ?Net 66 ml  ? ?Filed Weights  ? 03/31/21 2013  ?Weight: 70 kg  ? ? ?Exam: ? ?GEN: NAD ?SKIN: No rash ?EYES: EOMI, right visual field cut but improving ?ENT: MMM ?CV: RRR ?PULM: CTA B ?ABD: soft, ND, NT, +BS ?CNS: AAO x 3, non focal ?EXT: No edema or tenderness ? ? ?  ? ? ?  Data Reviewed:  ? ?I have personally reviewed following labs and imaging studies: ? ?Labs: ?Labs show the following:  ? ?Basic Metabolic Panel: ?Recent Labs  ?Lab 03/30/21 ?1548 03/31/21 ?0333  ?NA 139 139  ?K 4.0 3.5  ?CL 106 107  ?CO2 24 23  ?GLUCOSE 114* 86  ?BUN 13 12  ?CREATININE 0.71 0.63  ?CALCIUM 9.3 8.9  ?MG  --  1.9  ? ?GFR ?Estimated Creatinine Clearance: 96.4 mL/min (by C-G formula based on SCr of 0.63 mg/dL). ?Liver Function Tests: ?Recent Labs  ?Lab 03/31/21 ?0333  ?AST 16  ?ALT 12  ?ALKPHOS 53  ?BILITOT 0.7  ?PROT 6.3*  ?ALBUMIN 3.7  ? ?No results for input(s): LIPASE, AMYLASE in the last 168 hours. ?No results for input(s): AMMONIA in the last 168 hours. ?Coagulation profile ?No results for input(s): INR, PROTIME in the last 168 hours. ? ?CBC: ?Recent Labs  ?Lab 03/30/21 ?1548 03/31/21 ?0333  ?WBC 9.7 8.8  ?NEUTROABS 7.1 4.6  ?HGB 14.3  13.5  ?HCT 41.9 41.0  ?MCV 87.8 88.9  ?PLT 310 282  ? ?Cardiac Enzymes: ?No results for input(s): CKTOTAL, CKMB, CKMBINDEX, TROPONINI in the last 168 hours. ?BNP (last 3 results) ?No results for input(s): PROBNP in the last 8760 hours. ?CBG: ?No results for input(s): GLUCAP in the last 168 hours. ?D-Dimer: ?No results for input(s): DDIMER in the last 72 hours. ?Hgb A1c: ?No results for input(s): HGBA1C in the last 72 hours. ?Lipid Profile: ?No results for input(s): CHOL, HDL, LDLCALC, TRIG, CHOLHDL, LDLDIRECT in the last 72 hours. ?Thyroid function studies: ?No results for input(s): TSH, T4TOTAL, T3FREE, THYROIDAB in the last 72 hours. ? ?Invalid input(s): FREET3 ?Anemia work up: ?No results for input(s): VITAMINB12, FOLATE, FERRITIN, TIBC, IRON, RETICCTPCT in the last 72 hours. ?Sepsis Labs: ?Recent Labs  ?Lab 03/30/21 ?1548 03/31/21 ?0333  ?WBC 9.7 8.8  ? ? ?Microbiology ?Recent Results (from the past 240 hour(s))  ?Resp Panel by RT-PCR (Flu A&B, Covid) Nasopharyngeal Swab     Status: None  ? Collection Time: 03/30/21  8:49 PM  ? Specimen: Nasopharyngeal Swab; Nasopharyngeal(NP) swabs in vial transport medium  ?Result Value Ref Range Status  ? SARS Coronavirus 2 by RT PCR NEGATIVE NEGATIVE Final  ?  Comment: (NOTE) ?SARS-CoV-2 target nucleic acids are NOT DETECTED. ? ?The SARS-CoV-2 RNA is generally detectable in upper respiratory ?specimens during the acute phase of infection. The lowest ?concentration of SARS-CoV-2 viral copies this assay can detect is ?138 copies/mL. A negative result does not preclude SARS-Cov-2 ?infection and should not be used as the sole basis for treatment or ?other patient management decisions. A negative result may occur with  ?improper specimen collection/handling, submission of specimen other ?than nasopharyngeal swab, presence of viral mutation(s) within the ?areas targeted by this assay, and inadequate number of viral ?copies(<138 copies/mL). A negative result must be combined  with ?clinical observations, patient history, and epidemiological ?information. The expected result is Negative. ? ?Fact Sheet for Patients:  ?EntrepreneurPulse.com.au ? ?Fact Sheet for Healthcare Providers:  ?IncredibleEmployment.be ? ?This test is no t yet approved or cleared by the [place] FDA and  ?has been authorized for detection and/or diagnosis of SARS-CoV-2 by ?FDA under an Emergency Use Authorization (EUA). This EUA will remain  ?in effect (meaning this test can be used) for the duration of the ?COVID-19 declaration under Section 564(b)(1) of the Act, 21 ?U.S.C.section 360bbb-3(b)(1), unless the authorization is terminated  ?or revoked sooner.  ? ? ?  ? Influenza A by  PCR NEGATIVE NEGATIVE Final  ? Influenza B by PCR NEGATIVE NEGATIVE Final  ?  Comment: (NOTE) ?The Xpert Xpress SARS-CoV-2/FLU/RSV plus assay is intended as an aid ?in the diagnosis of influenza from Nasopharyngeal swab specimens and ?should not be used as a sole basis for treatment. Nasal washings and ?aspirates are unacceptable for Xpert Xpress SARS-CoV-2/FLU/RSV ?testing. ? ?Fact Sheet for Patients: ?EntrepreneurPulse.com.au ? ?Fact Sheet for Healthcare Providers: ?IncredibleEmployment.be ? ?This test is not yet approved or cleared by the [place] FDA and ?has been authorized for detection and/or diagnosis of SARS-CoV-2 by ?FDA under an Emergency Use Authorization (EUA). This EUA will remain ?in effect (meaning this test can be used) for the duration of the ?COVID-19 declaration under Section 564(b)(1) of the Act, 21 U.S.C. ?section 360bbb-3(b)(1), unless the authorization is terminated or ?revoked. ? ?Performed at [facility], [place] [address] [place], [place] ?[address] ?  ? ? ?Procedures and diagnostic studies: ? ?No results found. ? ? ? ? ? ? ? ? ? ? ? ? LOS: 3 days  ? ?Valta Dillon  ?[facility]  ? ?Pager on [email or web address]. If 7PM-7AM, please  contact night-coverage at [email or web address] ? ? ? ? ?04/03/2021, 1:52 PM  ? ? ? ? ? ? ? ? ? ?

## 2021-04-03 NOTE — Plan of Care (Signed)

## 2021-04-04 DIAGNOSIS — H469 Unspecified optic neuritis: Secondary | ICD-10-CM

## 2021-04-04 MED ORDER — CHOLECALCIFEROL 10 MCG (400 UNIT) PO TABS: 400.0000 [IU] | ORAL_TABLET | Freq: Every day | ORAL | 2 refills | Status: AC

## 2021-04-04 MED ORDER — CALCIUM CARBONATE 1250 (500 CA) MG PO TABS
1.0000 | ORAL_TABLET | Freq: Every day | ORAL | 2 refills | Status: DC
Start: 2021-04-04 — End: 2021-07-14

## 2021-04-04 NOTE — Progress Notes (Signed)
?  Transition of Care (TOC) Screening Note ? ? ?Patient Details  ?Name: Cindy Summers ?Date of Birth: Jan 24, 1986 ? ? ?Transition of Care (TOC) CM/SW Contact:    ?Harriet Masson, RN ?Phone Number: ?04/04/2021, 9:22 AM ? ? ? ?Transition of Care Department [facility]) has reviewed patient and no TOC needs have been identified at this time. We will continue to monitor patient advancement through interdisciplinary progression rounds. If new patient transition needs arise, please place a TOC consult. ? ? ?

## 2021-04-04 NOTE — TOC Transition Note (Signed)
Transition of Care (TOC) - CM/SW Discharge Note ? ? ?Patient Details  ?Name: Cindy Summers ?MRN: [id number] ?Date of Birth: 11-Apr-1985 ? ?Transition of Care (TOC) CM/SW Contact:  ?Cyndi Bender, RN ?Phone Number: ?04/04/2021, 10:42 AM ? ? ?Clinical Narrative:    ?Patient stable to discharge.  ?Mother will transport home. ?No TOC needs ? ? ?Final next level of care: Home/Self Care ?Barriers to Discharge: Barriers Resolved ? ? ?Patient Goals and CMS Choice ?Patient states their goals for this hospitalization and ongoing recovery are:: home ?  ?  ? ?Discharge Placement ?  ?           ?  ? home ?  ?  ? ?Discharge Plan and Services ?  ?  ?           ? home ?  ?  ?  ?  ?  ?  ?  ?  ?  ? ?Social Determinants of Health (SDOH) Interventions ?  ? ? ?Readmission Risk Interventions ?Readmission Risk Prevention Plan 04/04/2021  ?Post Dischage Appt Complete  ?Medication Screening Complete  ?Transportation Screening Complete  ?Some recent data might be hidden  ? ? ? ? ? ?

## 2021-04-04 NOTE — Discharge Summary (Signed)
Physician Discharge Summary  BAILYNN DYK NFA:[id number] DOB: 1986/01/01 DOA: 03/30/2021  PCP: Judy Pimple, MD  Admit date: 03/30/2021 Discharge date: 04/04/2021  Admitted From: Home Disposition: Home  Recommendations for Outpatient Follow-up:  Follow up with PCP in 1-2 weeks Follow-up with neurology, Dr. Epimenio Foot in 1-2 weeks for new diagnosis of optic neuritis, multiple sclerosis Continue to encourage tobacco/vaping cessation  Home Health: No Equipment/Devices: None  Discharge Condition: Stable CODE STATUS: Full code Diet recommendation: Regular diet  History of present illness:  UNIQUA KIHN is a 36 year-old female with past medical history significant for ADHD, bipolar disorder who presented to [facility] ED on 3/8 with acute right visual field defect.  Her symptoms started 3 days ago and she did not seek medical attention hoping that it will be self-limiting in nature.  She denies any personal or family history of autoimmune or demyelinating disease.  Initial MRI shows focus of increased T2 signal, possible concerning for MS. neurology was consulted.  TRH consulted for admission for possible right optic neuritis, to rule out demyelinating disease.    Hospital course:  Assessment and Plan: * Optic neuritis, right; multiple sclerosis Patient presented with acute right visual field defect involving inferior visual fields. Patient denies any personal or family history of demyelinating disease. No preceding trauma nor any history of recent viral syndrome. MRI brain as well as MRI orbits demonstrate no evidence of acute ischemic stroke, finding concerning for New MS diagnosis, optic neuritis. MRI C and T-spine finding consistent with chronic demyelinating lesion.  NMO IgG and Anti-MOG Ab were negative.  Neurology was consulted and recommended high-dose Solu-Medrol 1 g daily in which she completed a 5-day course.  During hospital course, patient vision slowly improved.  Patient was started on calcium  and vitamin D supplementation.  Will need close follow-up outpatient with neurology, Dr. Epimenio Foot in 1-2 weeks.   Attention deficit hyperactivity disorder (ADHD) Continue home Adderall and guanfacine.      Allergic rhinitis Continue loratadine.       Discharge Diagnoses:  Principal Problem:   Optic neuritis, right; multiple sclerosis Active Problems:   Attention deficit hyperactivity disorder (ADHD)   Allergic rhinitis    Discharge Instructions  Discharge Instructions     Ambulatory referral to Neurology   Complete by: As directed    An appointment is requested in approximately: 1 week   Call MD for:  difficulty breathing, headache or visual disturbances   Complete by: As directed    Call MD for:  extreme fatigue   Complete by: As directed    Call MD for:  persistant dizziness or light-headedness   Complete by: As directed    Call MD for:  persistant nausea and vomiting   Complete by: As directed    Call MD for:  severe uncontrolled pain   Complete by: As directed    Call MD for:  temperature >100.4   Complete by: As directed    Diet - low sodium heart healthy   Complete by: As directed    Increase activity slowly   Complete by: As directed       Allergies as of 04/04/2021   No Known Allergies      Medication List     STOP taking these medications    lamoTRIgine 150 MG tablet Commonly known as: LAMICTAL       TAKE these medications    amphetamine-dextroamphetamine 30 MG tablet Commonly known as: ADDERALL Take 30 mg by mouth 2 (two)  times daily. What changed: Another medication with the same name was removed. Continue taking this medication, and follow the directions you see here.   calcium carbonate 1250 (500 Ca) MG tablet Commonly known as: OS-CAL - dosed in mg of elemental calcium Take 1 tablet (500 mg of elemental calcium total) by mouth daily with breakfast.   cholecalciferol 10 MCG (400 UNIT) Tabs tablet Commonly known as: VITAMIN  D3 Take 1 tablet (400 Units total) by mouth daily.   guanFACINE 2 MG Tb24 ER tablet Commonly known as: INTUNIV Take 2 mg by mouth daily.   loratadine 10 MG tablet Commonly known as: CLARITIN Take 10 mg by mouth daily.   multivitamin with minerals Tabs tablet Take 1 tablet by mouth daily.   OVER THE COUNTER MEDICATION Take 1 tablet by mouth daily. Daily cleanse   PROBIOTIC ACIDOPHILUS PO Take 1 tablet by mouth daily. RAE - probiotic and prebiotic.   SYSTANE COMPLETE OP Place 1 drop into both eyes daily as needed (dry eyes).        Follow-up Information     Sater, Pearletha Furl, MD. Schedule an appointment as soon as possible for a visit in 2 week(s).   Specialty: Neurology Contact information: [address] [place] [address] [phone]         Judy Pimple, MD. Schedule an appointment as soon as possible for a visit in 1 week(s).   Specialties: Family Medicine, Radiology Contact information: [address] [place] [address] [phone]                No Known Allergies  Consultations: Neurology   Procedures/Studies: MR Brain W and Wo Contrast  Result Date: 03/30/2021 CLINICAL DATA:  Optic neuritis suspected EXAM: MRI HEAD AND ORBITS WITHOUT AND WITH CONTRAST TECHNIQUE: Multiplanar, multiecho pulse sequences of the brain and surrounding structures were obtained without and with intravenous contrast. Multiplanar, multiecho pulse sequences of the orbits and surrounding structures were obtained including fat saturation techniques, before and after intravenous contrast administration. CONTRAST:  7mL GADAVIST GADOBUTROL 1 MMOL/ML IV SOLN COMPARISON:  None. FINDINGS: MRI HEAD FINDINGS Brain: No restricted diffusion to suggest acute or subacute infarct. No acute hemorrhage, mass, mass effect, or midline shift. No hydrocephalus or extra-axial collection. Foci of T2 hyperintense signal, which are somewhat radially oriented from the ventricles  (series 701, image 22 and 14), as well as juxtacortical (series 701, image 13). No infratentorial T2 hyperintense signal. No abnormal parenchymal or meningeal enhancement. Vascular: Normal flow voids. Skull and upper cervical spine: Normal marrow signal. Other: The mastoids are well aerated. MRI ORBITS FINDINGS Orbits: Increased T2 hyperintense signal in the right optic nerve sheath compared to the left (series 10, image 12 and series 9, image 13) without definite increased T2 signal within the nerve. Suspect enhancement of the prechiasmatic right optic nerve (series 14, image 9 and series 15, image 12). The orbits are otherwise unremarkable. Visualized sinuses: Small mucous retention cysts in the left ethmoid air cells. Soft tissues: Negative. IMPRESSION: 1. Increased T2 hyperintense signal in the right optic nerve sheath compared to the left, without definite increased T2 signal within the right optic nerve, and with possible enhancement of the prechiasmatic right optic nerve, concerning but not conclusive for optic neuritis. 2. Foci of increased T2 signal that are somewhat radially oriented from the ventricles as well as juxtacortical, which are nonspecific but can be seen in the setting of demyelinating disease, such as multiple sclerosis. No contrast enhancement to suggest  active demyelination. Electronically Signed   By: Wiliam Ke M.D.   On: 03/30/2021 18:40   MR CERVICAL SPINE W WO CONTRAST  Result Date: 03/31/2021 CLINICAL DATA:  Optic neuritis, possible multiple sclerosis EXAM: MRI CERVICAL AND THORACIC SPINE WITHOUT AND WITH CONTRAST TECHNIQUE: Multiplanar and multiecho pulse sequences of the cervical spine, to include the craniocervical junction and cervicothoracic junction, and the thoracic spine, were obtained without and with intravenous contrast. CONTRAST:  68mL GADAVIST GADOBUTROL 1 MMOL/ML IV SOLN COMPARISON:  None. FINDINGS: MRI CERVICAL SPINE Alignment: Preserved. Vertebrae: Vertebral body  heights are maintained. No marrow edema. No suspicious osseous lesion. Cord: Small nonenhancing foci of T2 hyperintensity at the right and left lateral aspect of the cord at the C1 level. Posterior Fossa, vertebral arteries, paraspinal tissues: Unremarkable. Intracranial findings are discussed on prior dedicated study. Disc levels: Intervertebral disc heights and signal are maintained. There is a disc bulge with endplate osteophytes at C5-C6 without stenosis. MRI THORACIC SPINE Alignment:  Preserved. Vertebrae: Vertebral body heights are maintained. No marrow edema. No suspicious osseous lesion. Cord:  No definite abnormal signal.  No abnormal enhancement. Paraspinal and other soft tissues: Unremarkable. Disc levels: Intervertebral disc heights and signal are maintained. There is no significant disc herniation or stenosis at any level. IMPRESSION: Abnormal cord T2 hyperintensity at the C1 level suspicious for chronic demyelination given MRI brain findings. No abnormal thoracic cord signal. No abnormal enhancement to suggest active demyelination. Electronically Signed   By: Guadlupe Spanish M.D.   On: 03/31/2021 09:54   MR THORACIC SPINE W WO CONTRAST  Result Date: 03/31/2021 CLINICAL DATA:  Optic neuritis, possible multiple sclerosis EXAM: MRI CERVICAL AND THORACIC SPINE WITHOUT AND WITH CONTRAST TECHNIQUE: Multiplanar and multiecho pulse sequences of the cervical spine, to include the craniocervical junction and cervicothoracic junction, and the thoracic spine, were obtained without and with intravenous contrast. CONTRAST:  23mL GADAVIST GADOBUTROL 1 MMOL/ML IV SOLN COMPARISON:  None. FINDINGS: MRI CERVICAL SPINE Alignment: Preserved. Vertebrae: Vertebral body heights are maintained. No marrow edema. No suspicious osseous lesion. Cord: Small nonenhancing foci of T2 hyperintensity at the right and left lateral aspect of the cord at the C1 level. Posterior Fossa, vertebral arteries, paraspinal tissues: Unremarkable.  Intracranial findings are discussed on prior dedicated study. Disc levels: Intervertebral disc heights and signal are maintained. There is a disc bulge with endplate osteophytes at C5-C6 without stenosis. MRI THORACIC SPINE Alignment:  Preserved. Vertebrae: Vertebral body heights are maintained. No marrow edema. No suspicious osseous lesion. Cord:  No definite abnormal signal.  No abnormal enhancement. Paraspinal and other soft tissues: Unremarkable. Disc levels: Intervertebral disc heights and signal are maintained. There is no significant disc herniation or stenosis at any level. IMPRESSION: Abnormal cord T2 hyperintensity at the C1 level suspicious for chronic demyelination given MRI brain findings. No abnormal thoracic cord signal. No abnormal enhancement to suggest active demyelination. Electronically Signed   By: Guadlupe Spanish M.D.   On: 03/31/2021 09:54   MR ORBITS W WO CONTRAST  Result Date: 03/30/2021 CLINICAL DATA:  Optic neuritis suspected EXAM: MRI HEAD AND ORBITS WITHOUT AND WITH CONTRAST TECHNIQUE: Multiplanar, multiecho pulse sequences of the brain and surrounding structures were obtained without and with intravenous contrast. Multiplanar, multiecho pulse sequences of the orbits and surrounding structures were obtained including fat saturation techniques, before and after intravenous contrast administration. CONTRAST:  71mL GADAVIST GADOBUTROL 1 MMOL/ML IV SOLN COMPARISON:  None. FINDINGS: MRI HEAD FINDINGS Brain: No restricted diffusion to suggest acute or subacute infarct.  No acute hemorrhage, mass, mass effect, or midline shift. No hydrocephalus or extra-axial collection. Foci of T2 hyperintense signal, which are somewhat radially oriented from the ventricles (series 701, image 22 and 14), as well as juxtacortical (series 701, image 13). No infratentorial T2 hyperintense signal. No abnormal parenchymal or meningeal enhancement. Vascular: Normal flow voids. Skull and upper cervical spine: Normal  marrow signal. Other: The mastoids are well aerated. MRI ORBITS FINDINGS Orbits: Increased T2 hyperintense signal in the right optic nerve sheath compared to the left (series 10, image 12 and series 9, image 13) without definite increased T2 signal within the nerve. Suspect enhancement of the prechiasmatic right optic nerve (series 14, image 9 and series 15, image 12). The orbits are otherwise unremarkable. Visualized sinuses: Small mucous retention cysts in the left ethmoid air cells. Soft tissues: Negative. IMPRESSION: 1. Increased T2 hyperintense signal in the right optic nerve sheath compared to the left, without definite increased T2 signal within the right optic nerve, and with possible enhancement of the prechiasmatic right optic nerve, concerning but not conclusive for optic neuritis. 2. Foci of increased T2 signal that are somewhat radially oriented from the ventricles as well as juxtacortical, which are nonspecific but can be seen in the setting of demyelinating disease, such as multiple sclerosis. No contrast enhancement to suggest active demyelination. Electronically Signed   By: Wiliam Ke M.D.   On: 03/30/2021 18:40     Subjective:   Discharge Exam: Vitals:   04/04/21 0416 04/04/21 0741  BP: 116/87 111/86  Pulse: 60 91  Resp: 18 18  Temp: 98.3 F (36.8 C) 97.7 F (36.5 C)  SpO2: 99%    Vitals:   04/04/21 0004 04/04/21 0005 04/04/21 0416 04/04/21 0741  BP: 125/90  116/87 111/86  Pulse: 89  60 91  Resp: 18  18 18   Temp:   98.3 F (36.8 C) 97.7 F (36.5 C)  TempSrc:   Oral Oral  SpO2: 99% 95% 99%   Weight:      Height:        Physical Exam: GEN: NAD, alert and oriented x 3, wd/wn HEENT: NCAT, PERRL, EOMI, sclera clear, MMM PULM: CTAB w/o wheezes/crackles, normal respiratory effort, on room air CV: RRR w/o M/G/R GI: abd soft, NTND, NABS, no R/G/M MSK: no peripheral edema, muscle strength globally intact 5/5 bilateral upper/lower extremities NEURO: CN II-XII intact,  no focal deficits, sensation to light touch intact PSYCH: normal mood/affect Integumentary: dry/intact, no rashes or wounds    The results of significant diagnostics from this hospitalization (including imaging, microbiology, ancillary and laboratory) are listed below for reference.     Microbiology: Recent Results (from the past 240 hour(s))  Resp Panel by RT-PCR (Flu A&B, Covid) Nasopharyngeal Swab     Status: None   Collection Time: 03/30/21  8:49 PM   Specimen: Nasopharyngeal Swab; Nasopharyngeal(NP) swabs in vial transport medium  Result Value Ref Range Status   SARS Coronavirus 2 by RT PCR NEGATIVE NEGATIVE Final    Comment: (NOTE) SARS-CoV-2 target nucleic acids are NOT DETECTED.  The SARS-CoV-2 RNA is generally detectable in upper respiratory specimens during the acute phase of infection. The lowest concentration of SARS-CoV-2 viral copies this assay can detect is 138 copies/mL. A negative result does not preclude SARS-Cov-2 infection and should not be used as the sole basis for treatment or other patient management decisions. A negative result may occur with  improper specimen collection/handling, submission of specimen other than nasopharyngeal swab, presence of viral mutation(s)  within the areas targeted by this assay, and inadequate number of viral copies(<138 copies/mL). A negative result must be combined with clinical observations, patient history, and epidemiological information. The expected result is Negative.  Fact Sheet for Patients:  BloggerCourse.com  Fact Sheet for Healthcare Providers:  SeriousBroker.it  This test is no t yet approved or cleared by the [place] FDA and  has been authorized for detection and/or diagnosis of SARS-CoV-2 by FDA under an Emergency Use Authorization (EUA). This EUA will remain  in effect (meaning this test can be used) for the duration of the COVID-19 declaration under  Section 564(b)(1) of the Act, 21 U.S.C.section 360bbb-3(b)(1), unless the authorization is terminated  or revoked sooner.       Influenza A by PCR NEGATIVE NEGATIVE Final   Influenza B by PCR NEGATIVE NEGATIVE Final    Comment: (NOTE) The Xpert Xpress SARS-CoV-2/FLU/RSV plus assay is intended as an aid in the diagnosis of influenza from Nasopharyngeal swab specimens and should not be used as a sole basis for treatment. Nasal washings and aspirates are unacceptable for Xpert Xpress SARS-CoV-2/FLU/RSV testing.  Fact Sheet for Patients: BloggerCourse.com  Fact Sheet for Healthcare Providers: SeriousBroker.it  This test is not yet approved or cleared by the [place] FDA and has been authorized for detection and/or diagnosis of SARS-CoV-2 by FDA under an Emergency Use Authorization (EUA). This EUA will remain in effect (meaning this test can be used) for the duration of the COVID-19 declaration under Section 564(b)(1) of the Act, 21 U.S.C. section 360bbb-3(b)(1), unless the authorization is terminated or revoked.  Performed at [facility], [address], [place], [place] [address]      Labs: BNP (last 3 results) No results for input(s): BNP in the last 8760 hours. Basic Metabolic Panel: Recent Labs  Lab 03/30/21 1548 03/31/21 0333  NA 139 139  K 4.0 3.5  CL 106 107  CO2 24 23  GLUCOSE 114* 86  BUN 13 12  CREATININE 0.71 0.63  CALCIUM 9.3 8.9  MG  --  1.9   Liver Function Tests: Recent Labs  Lab 03/31/21 0333  AST 16  ALT 12  ALKPHOS 53  BILITOT 0.7  PROT 6.3*  ALBUMIN 3.7   No results for input(s): LIPASE, AMYLASE in the last 168 hours. No results for input(s): AMMONIA in the last 168 hours. CBC: Recent Labs  Lab 03/30/21 1548 03/31/21 0333  WBC 9.7 8.8  NEUTROABS 7.1 4.6  HGB 14.3 13.5  HCT 41.9 41.0  MCV 87.8 88.9  PLT 310 282   Cardiac Enzymes: No results for input(s): CKTOTAL,  CKMB, CKMBINDEX, TROPONINI in the last 168 hours. BNP: Invalid input(s): POCBNP CBG: No results for input(s): GLUCAP in the last 168 hours. D-Dimer No results for input(s): DDIMER in the last 72 hours. Hgb A1c No results for input(s): HGBA1C in the last 72 hours. Lipid Profile No results for input(s): CHOL, HDL, LDLCALC, TRIG, CHOLHDL, LDLDIRECT in the last 72 hours. Thyroid function studies No results for input(s): TSH, T4TOTAL, T3FREE, THYROIDAB in the last 72 hours.  Invalid input(s): FREET3 Anemia work up No results for input(s): VITAMINB12, FOLATE, FERRITIN, TIBC, IRON, RETICCTPCT in the last 72 hours. Urinalysis No results found for: COLORURINE, APPEARANCEUR, LABSPEC, PHURINE, GLUCOSEU, HGBUR, BILIRUBINUR, KETONESUR, PROTEINUR, UROBILINOGEN, NITRITE, LEUKOCYTESUR Sepsis Labs Invalid input(s): PROCALCITONIN,  WBC,  LACTICIDVEN Microbiology Recent Results (from the past 240 hour(s))  Resp Panel by RT-PCR (Flu A&B, Covid) Nasopharyngeal Swab     Status: None  Collection Time: 03/30/21  8:49 PM   Specimen: Nasopharyngeal Swab; Nasopharyngeal(NP) swabs in vial transport medium  Result Value Ref Range Status   SARS Coronavirus 2 by RT PCR NEGATIVE NEGATIVE Final    Comment: (NOTE) SARS-CoV-2 target nucleic acids are NOT DETECTED.  The SARS-CoV-2 RNA is generally detectable in upper respiratory specimens during the acute phase of infection. The lowest concentration of SARS-CoV-2 viral copies this assay can detect is 138 copies/mL. A negative result does not preclude SARS-Cov-2 infection and should not be used as the sole basis for treatment or other patient management decisions. A negative result may occur with  improper specimen collection/handling, submission of specimen other than nasopharyngeal swab, presence of viral mutation(s) within the areas targeted by this assay, and inadequate number of viral copies(<138 copies/mL). A negative result must be combined with clinical  observations, patient history, and epidemiological information. The expected result is Negative.  Fact Sheet for Patients:  BloggerCourse.com  Fact Sheet for Healthcare Providers:  SeriousBroker.it  This test is no t yet approved or cleared by the [place] FDA and  has been authorized for detection and/or diagnosis of SARS-CoV-2 by FDA under an Emergency Use Authorization (EUA). This EUA will remain  in effect (meaning this test can be used) for the duration of the COVID-19 declaration under Section 564(b)(1) of the Act, 21 U.S.C.section 360bbb-3(b)(1), unless the authorization is terminated  or revoked sooner.       Influenza A by PCR NEGATIVE NEGATIVE Final   Influenza B by PCR NEGATIVE NEGATIVE Final    Comment: (NOTE) The Xpert Xpress SARS-CoV-2/FLU/RSV plus assay is intended as an aid in the diagnosis of influenza from Nasopharyngeal swab specimens and should not be used as a sole basis for treatment. Nasal washings and aspirates are unacceptable for Xpert Xpress SARS-CoV-2/FLU/RSV testing.  Fact Sheet for Patients: BloggerCourse.com  Fact Sheet for Healthcare Providers: SeriousBroker.it  This test is not yet approved or cleared by the [place] FDA and has been authorized for detection and/or diagnosis of SARS-CoV-2 by FDA under an Emergency Use Authorization (EUA). This EUA will remain in effect (meaning this test can be used) for the duration of the COVID-19 declaration under Section 564(b)(1) of the Act, 21 U.S.C. section 360bbb-3(b)(1), unless the authorization is terminated or revoked.  Performed at [facility], [address] [place], [place] [address]      Time coordinating discharge: Over 30 minutes  SIGNED:   Alvira Philips [place], DO  [facility] 04/04/2021, 9:53 AM

## 2021-04-04 NOTE — Progress Notes (Signed)
Discharge instructions given. Patient verbalized understanding and all questions were answered.  ?

## 2021-04-05 ENCOUNTER — Other Ambulatory Visit (HOSPITAL_COMMUNITY)
Admission: RE | Admit: 2021-04-05 | Discharge: 2021-04-05 | Disposition: A | Discharge status: Home / Self Care | Payer: Managed Care, Other (non HMO) | Source: Ambulatory Visit | Attending: Family Medicine | Admitting: Family Medicine

## 2021-04-05 ENCOUNTER — Ambulatory Visit (INDEPENDENT_AMBULATORY_CARE_PROVIDER_SITE_OTHER): Payer: Managed Care, Other (non HMO) | Admitting: Family Medicine

## 2021-04-05 ENCOUNTER — Other Ambulatory Visit: Payer: Self-pay

## 2021-04-05 ENCOUNTER — Encounter: Payer: Self-pay | Admitting: Family Medicine

## 2021-04-05 VITALS — BP 114/64 | HR 110 | Ht 67.0 in | Wt 162.0 lb

## 2021-04-05 DIAGNOSIS — H469 Unspecified optic neuritis: Secondary | ICD-10-CM

## 2021-04-05 DIAGNOSIS — Z01419 Encounter for gynecological examination (general) (routine) without abnormal findings: Secondary | ICD-10-CM

## 2021-04-05 DIAGNOSIS — F900 Attention-deficit hyperactivity disorder, predominantly inattentive type: Secondary | ICD-10-CM | POA: Diagnosis not present

## 2021-04-05 DIAGNOSIS — Z720 Tobacco use: Secondary | ICD-10-CM

## 2021-04-05 DIAGNOSIS — Z0000 Encounter for general adult medical examination without abnormal findings: Secondary | ICD-10-CM

## 2021-04-05 DIAGNOSIS — F319 Bipolar disorder, unspecified: Secondary | ICD-10-CM | POA: Diagnosis not present

## 2021-04-05 DIAGNOSIS — Z1159 Encounter for screening for other viral diseases: Secondary | ICD-10-CM | POA: Insufficient documentation

## 2021-04-05 DIAGNOSIS — Z1331 Encounter for screening for depression: Secondary | ICD-10-CM | POA: Insufficient documentation

## 2021-04-05 NOTE — Assessment & Plan Note (Signed)
Routine exam and pap done today  ?Pt plans to call us back with the brand of OC she prefers ?gc and chlamydia screen added to pap ?

## 2021-04-05 NOTE — Patient Instructions (Addendum)
Go ahead and call the neurology office for Dr Epimenio Foot to get an appt ? ?Take the calcium with D once daily  ?If too constipated take it every other day ?A stool softener is ok  ? ?You need a tetanus shot if you have not had one in 10 years  ?Call the pharmacy who did your last oral contraceptive and let us know  ? ?Lab today for cholesterol and thyroid and hep C screen  ? ?Pap done today  ? ?Take care of yourself  ?Use sun protection  ?Stay active  ? ? ?

## 2021-04-05 NOTE — Progress Notes (Signed)
? ?Subjective:  ? ? Patient ID: Cindy Summers, female    DOB: 08-10-1985, 36 y.o.   MRN: [id number] ? ?This visit occurred during the SARS-CoV-2 public health emergency.  Safety protocols were in place, including screening questions prior to the visit, additional usage of staff PPE, and extensive cleaning of exam room while observing appropriate contact time as indicated for disinfecting solutions.  ? ?HPI ?Patient presents for follow-up of hospitalization for optic neuritis and also annual exam  ? ?Wt Readings from Last 3 Encounters:  ?04/05/21 162 lb (73.5 kg)  ?03/31/21 154 lb 5.2 oz (70 kg)  ?12/02/20 154 lb (69.9 kg)  ? ?25.37 kg/m? ? ? ?She was hosp from 3/8 to 3/13 ? ?Hosp course as follows: ? ?Assessment and Plan: ?* Optic neuritis, right; multiple sclerosis ?Patient presented with acute right visual field defect involving inferior visual fields. Patient denies any personal or family history of demyelinating disease. No preceding trauma nor any history of recent viral syndrome. MRI brain as well as MRI orbits demonstrate no evidence of acute ischemic stroke, finding concerning for New MS diagnosis, optic neuritis. MRI C and T-spine finding consistent with chronic demyelinating lesion.  NMO IgG and Anti-MOG Ab were negative.  Neurology was consulted and recommended high-dose Solu-Medrol 1 g daily in which she completed a 5-day course.  During hospital course, patient vision slowly improved.  Patient was started on calcium and vitamin D supplementation.  Will need close follow-up outpatient with neurology, Dr. Epimenio Foot in 1-2 weeks ? ?MRI ?IMPRESSION: ?1. Increased T2 hyperintense signal in the right optic nerve sheath ?compared to the left, without definite increased T2 signal within ?the right optic nerve, and with possible enhancement of the ?prechiasmatic right optic nerve, concerning but not conclusive for ?optic neuritis. ?2. Foci of increased T2 signal that are somewhat radially oriented ?from the ventricles  as well as juxtacortical, which are nonspecific ?but can be seen in the setting of demyelinating disease, such as ?multiple sclerosis. No contrast enhancement to suggest active ?demyelination. ?  ?Lab Results  ?Component Value Date  ? CREATININE 0.63 03/31/2021  ? BUN 12 03/31/2021  ? NA 139 03/31/2021  ? K 3.5 03/31/2021  ? CL 107 03/31/2021  ? CO2 23 03/31/2021  ? ?Lab Results  ?Component Value Date  ? ALT 12 03/31/2021  ? AST 16 03/31/2021  ? ALKPHOS 53 03/31/2021  ? BILITOT 0.7 03/31/2021  ? ?Lab Results  ?Component Value Date  ? CRP 0.6 03/31/2021  ? ?Lab Results  ?Component Value Date  ? ESRSEDRATE 4 03/31/2021  ? ? ?Vision is improved  ?She is finally seeing colors again  ?Trying to have a positive outlook  ? ?She was px ca and D3  (600 mg ca)  ?Giving her some constipation  ? ?She goes to the tanning bed  ?Goes to dermatology regularly  ? ? ? ?Lab Results  ?Component Value Date  ? WBC 8.8 03/31/2021  ? HGB 13.5 03/31/2021  ? HCT 41.0 03/31/2021  ? MCV 88.9 03/31/2021  ? PLT 282 03/31/2021  ? ?Health mt: ? ?Hep C screen  ?HIV screen recent negative  ? ?Tetanus shot-unsure when last one -thinks 7 years ago  ?Flu shot 11/2020 ? ?Pap and gyn care  - no h/o abn pap  ?Has been ? 5-6 years since last  ?We never got info from [facility]  ?Contraception -liked her last OC ? ?Periods are fairly regularly  ?Not problematic  ?Does get tired before menses  ? ?  Self breast exam : no lumps  ? ?Family history: father had ALS  ? ?Sees psychiatry for bipolar disorder and ADD ?Takes guanfacine ?Also adderall  ? ?No smoking but does vape ?Some nicotine  ?Will eventually dial back the nicotine  ? ?Patient Active Problem List  ? Diagnosis Date Noted  ?? Encounter for hepatitis C screening test for low risk patient 04/05/2021  ?? Visit for routine gyn exam 04/05/2021  ?? Optic neuritis, right; multiple sclerosis 03/30/2021  ?? Allergic rhinitis 03/30/2021  ?? Routine general medical examination at a health care facility  03/07/2021  ?? Vapes nicotine containing substance 12/02/2020  ?? Lesion of skin of face 12/02/2020  ?? Menstrual problem 12/02/2020  ?? Bipolar affective disorder (HCC) 08/11/2008  ?? Attention deficit hyperactivity disorder (ADHD) 08/11/2008  ? ?Past Medical History:  ?Diagnosis Date  ?? History of ADHD   ?? History of anxiety disorder   ?? History of bipolar disorder   ? ?Past Surgical History:  ?Procedure Laterality Date  ?? NO PAST SURGERIES    ? ?Social History  ? ?Tobacco Use  ?? Smoking status: Former  ?  Types: Cigarettes  ?? Smokeless tobacco: Never  ?Vaping Use  ?? Vaping Use: Every day  ?Substance Use Topics  ?? Alcohol use: Yes  ?  Comment: occ  ?? Drug use: Never  ? ?Family History  ?Problem Relation Age of Onset  ?? ALS Father   ?? Diabetes Maternal Grandmother   ? ?No Known Allergies ?Current Outpatient Medications on File Prior to Visit  ?Medication Sig Dispense Refill  ?? amphetamine-dextroamphetamine (ADDERALL) 30 MG tablet Take 30 mg by mouth 2 (two) times daily.    ?? calcium carbonate (OS-CAL - DOSED IN MG OF ELEMENTAL CALCIUM) 1250 (500 Ca) MG tablet Take 1 tablet (500 mg of elemental calcium total) by mouth daily with breakfast. 30 tablet 2  ?? cholecalciferol (VITAMIN D3) 10 MCG (400 UNIT) TABS tablet Take 1 tablet (400 Units total) by mouth daily. 30 tablet 2  ?? Lactobacillus (PROBIOTIC ACIDOPHILUS PO) Take 1 tablet by mouth daily. RAE - probiotic and prebiotic.    ?? loratadine (CLARITIN) 10 MG tablet Take 10 mg by mouth daily.    ?? Multiple Vitamin (MULTIVITAMIN WITH MINERALS) TABS tablet Take 1 tablet by mouth daily.    ?? OVER THE COUNTER MEDICATION Take 1 tablet by mouth daily. Daily cleanse    ?? Propylene Glycol (SYSTANE COMPLETE OP) Place 1 drop into both eyes daily as needed (dry eyes).    ?? guanFACINE (INTUNIV) 2 MG TB24 ER tablet Take 2 mg by mouth daily. (Patient not taking: Reported on 04/05/2021)    ? ?No current facility-administered medications on file prior to visit.  ?   ? ? ?Review of Systems  ?Constitutional:  Positive for fatigue. Negative for activity change, appetite change, fever and unexpected weight change.  ?HENT:  Negative for congestion, ear pain, rhinorrhea, sinus pressure and sore throat.   ?Eyes:  Positive for visual disturbance. Negative for pain and redness.  ?Respiratory:  Negative for cough, shortness of breath and wheezing.   ?Cardiovascular:  Negative for chest pain and palpitations.  ?Gastrointestinal:  Negative for abdominal pain, blood in stool, constipation and diarrhea.  ?Endocrine: Negative for polydipsia and polyuria.  ?Genitourinary:  Negative for dysuria, frequency, menstrual problem, pelvic pain and urgency.  ?Musculoskeletal:  Negative for arthralgias, back pain and myalgias.  ?Skin:  Negative for pallor and rash.  ?Allergic/Immunologic: Negative for environmental allergies.  ?Neurological:  Negative  for dizziness, syncope and headaches.  ?Hematological:  Negative for adenopathy. Does not bruise/bleed easily.  ?Psychiatric/Behavioral:  Negative for decreased concentration and dysphoric mood. The patient is not nervous/anxious.   ? ?   ?Objective:  ? Physical Exam ?Constitutional:   ?   General: She is not in acute distress. ?   Appearance: Normal appearance. She is well-developed and normal weight. She is not ill-appearing or diaphoretic.  ?HENT:  ?   Head: Normocephalic and atraumatic.  ?   Right Ear: Tympanic membrane, ear canal and external ear normal.  ?   Left Ear: Tympanic membrane, ear canal and external ear normal.  ?   Nose: Nose normal. No congestion.  ?   Mouth/Throat:  ?   Mouth: Mucous membranes are moist.  ?   Pharynx: Oropharynx is clear. No posterior oropharyngeal erythema.  ?Eyes:  ?   General: No scleral icterus. ?   Extraocular Movements: Extraocular movements intact.  ?   Conjunctiva/sclera: Conjunctivae normal.  ?   Pupils: Pupils are equal, round, and reactive to light.  ?Neck:  ?   Thyroid: No thyromegaly.  ?   Vascular: No  carotid bruit or JVD.  ?Cardiovascular:  ?   Rate and Rhythm: Normal rate and regular rhythm.  ?   Pulses: Normal pulses.  ?   Heart sounds: Normal heart sounds.  ?  No gallop.  ?Pulmonary:  ?   Effort: Pulm

## 2021-04-05 NOTE — Assessment & Plan Note (Signed)
Encouraged her to wean nicotine and eventually get away from vaping  ? ?Commended on cigarette cessation  ?

## 2021-04-05 NOTE — Assessment & Plan Note (Signed)
Under care of psychiatry for this and ADD ?Takes ?Guanfacine and adderall ?

## 2021-04-05 NOTE — Assessment & Plan Note (Signed)
Screen added to labs  ?

## 2021-04-05 NOTE — Assessment & Plan Note (Signed)
Under care of psychiatry  ?Guanfacine and adderall  ?Encouraged exercise and self care  ?

## 2021-04-05 NOTE — Assessment & Plan Note (Signed)
Reviewed health habits including diet and exercise and skin cancer prevention ?Reviewed appropriate screening tests for age  ?Also reviewed health mt list, fam hx and immunization status , as well as social and family history   ?See HPI ?Labs ordered and also reviewed from recent hospitalization  ?Enc her to stop tanning and also vaping  ?Hep C screen done  ?Pt declined tetanus shot until she can find last date of this  ?Gyn exam done with pap ? ?

## 2021-04-05 NOTE — Assessment & Plan Note (Signed)
Vision has improved  ?Reviewed hospital records, lab results and studies in detail  ?Planning f/u with neurology  ?

## 2021-04-06 LAB — TSH: TSH: 0.4 u[IU]/mL (ref 0.35–5.50)

## 2021-04-06 LAB — LIPID PANEL
Cholesterol: 148 mg/dL (ref 0–200)
HDL: 67.4 mg/dL (ref >39.00)
NonHDL: 80.95
Total CHOL/HDL Ratio: 2
Triglycerides: 212 mg/dL — ABNORMAL HIGH (ref 0.0–149.0)
VLDL: 42.4 mg/dL — ABNORMAL HIGH (ref 0.0–40.0)

## 2021-04-06 LAB — HEPATITIS C ANTIBODY
Hepatitis C Ab: NONREACTIVE
SIGNAL TO CUT-OFF: <0.02 (ref <1.00)

## 2021-04-06 LAB — LDL CHOLESTEROL, DIRECT: Direct LDL: 58 mg/dL

## 2021-04-08 ENCOUNTER — Telehealth: Payer: Self-pay

## 2021-04-08 LAB — CYTOLOGY - PAP
Adequacy: ABSENT
Chlamydia: NEGATIVE
Comment: NEGATIVE
Comment: NEGATIVE
Comment: NORMAL
Diagnosis: NEGATIVE
High risk HPV: NEGATIVE
Neisseria Gonorrhea: NEGATIVE

## 2021-04-08 NOTE — Telephone Encounter (Signed)
Patient is returning missed call. Please advise 

## 2021-04-08 NOTE — Telephone Encounter (Signed)
Results reviewed with patient

## 2021-04-13 ENCOUNTER — Telehealth: Payer: Self-pay | Admitting: *Deleted

## 2021-04-13 ENCOUNTER — Encounter: Payer: Self-pay | Admitting: Neurology

## 2021-04-13 ENCOUNTER — Ambulatory Visit: Payer: Managed Care, Other (non HMO) | Admitting: Neurology

## 2021-04-13 VITALS — BP 137/85 | HR 103 | Ht 67.0 in | Wt 161.0 lb

## 2021-04-13 DIAGNOSIS — F319 Bipolar disorder, unspecified: Secondary | ICD-10-CM

## 2021-04-13 DIAGNOSIS — G35 Multiple sclerosis: Secondary | ICD-10-CM | POA: Diagnosis not present

## 2021-04-13 DIAGNOSIS — H469 Unspecified optic neuritis: Secondary | ICD-10-CM

## 2021-04-13 DIAGNOSIS — Z79899 Other long term (current) drug therapy: Secondary | ICD-10-CM | POA: Diagnosis not present

## 2021-04-13 NOTE — Telephone Encounter (Signed)
Placed [facility] lab in [facility] lock box for routine lab pick up. Results pending. 

## 2021-04-13 NOTE — Progress Notes (Signed)
? ?[facility] ? ?PATIENT: Cindy Summers ?DOB: [birth year] ? ?REFERRING DOCTOR OR PCP:  Loura Pardon, MD ?SOURCE: Patient, notes from recent hospitalization, imaging and lab reports, MRI images personally reviewed. ? ?_________________________________ ? ? ?HISTORICAL ? ?CHIEF COMPLAINT:  ?Chief Complaint  ?Patient presents with  ? New Patient (Initial Visit)  ?  Pt with mom, rm 1. She has been having noticeable problems with vision on the right side. She states this began an issues march 5th. Went into the hospital on march 8th after seeing her eye MD. In the hospital she had a persistent headache on right side.  ? ? ?HISTORY OF PRESENT ILLNESS:  ?I had the pleasure of seeing your patient, Cindy Summers, at the [place] at [facility] for neurologic consultation regarding her recent optic neuritis and abnormal brain MRI. ? ?She is a 36 year old woman who had the onset of right visual changes 03/27/2021.   She was watching TV and noted visual changes like a smudge and darkening of her glasses.   The next day, there seemed to be a dark glare out of the right eye.   She then noted that she could not see out of the right eye.   She went to [facility] 03/30/2021 and was diagnosed with right otic neuritis and told to go to the ED for steroids and further evaluation.   On her examination she had reduced inferior visual field and central vision.   She could count fingers but not read out of that eye.   She received IV steroids and had imaging studies.   MRI of the brain showed foci c/w MS and she also had enhancement of the right optic nerve.    The next day, MRI of the spie showed a single focus just below the cervicomedullary junction.   She felt some improvement a day after the first steroid infusion and was seeing better upon discharge at 5 days.   Color saturation and acuity were still reduced OD.     In retrospect she has sometimes felt clumsy at times but never had a period > 24 hours of  clumsiness, weakness or numbness.   ? ?She denies weakness or numbness.   She has mild urinary urgency but no incontinence.   She currently has reduced color vision and 20/100 acuity OD.    ? ?She has biploar disorder.   She used to be on lamotrigine.  Vraylar was just started.   She is also on Adderall.    ? ?Data ?Imaging personally reviewed: ?MRI of the brain and orbits 03/30/2021 shows scattered T2/FLAIR hyperintense foci in the periventricular and juxtacortical white matter.  There is enhancement of the prechiasmatic right optic nerve.  This is consistent with acute optic neuritis and chronic demyelination consistent with MS. ? ?MRI of the cervical and thoracic spine 03/31/2021 shows a T2/flair hyperintense focus at the cervicomedullary junction consistent with demyelination.  Disc protrusion at C5-C6 that does not cause nerve root compression or spinal stenosis.  No abnormal enhancement. ? ?Laboratory ?March 2023: ESR, CRP, neuromyelitis optica antibody, anti-MOG antibody, HIV, hepatitis C were negative or normal ? ?REVIEW OF SYSTEMS: ?Constitutional: No fevers, chills, sweats, or change in appetite ?Eyes: No visual changes, double vision, eye pain ?Ear, nose and throat: No hearing loss, ear pain, nasal congestion, sore throat ?Cardiovascular: No chest pain, palpitations ?Respiratory:  No shortness of breath at rest or with exertion.   No wheezes ?GastrointestinaI: No nausea, vomiting, diarrhea, abdominal pain, fecal  incontinence ?Genitourinary:  No dysuria, urinary retention or frequency.  No nocturia. ?Musculoskeletal:  No neck pain, back pain ?Integumentary: No rash, pruritus, skin lesions ?Neurological: as above ?Psychiatric: No depression at this time.  No anxiety ?Endocrine: No palpitations, diaphoresis, change in appetite, change in weigh or increased thirst ?Hematologic/Lymphatic:  No anemia, purpura, petechiae. ?Allergic/Immunologic: No itchy/runny eyes, nasal congestion, recent allergic reactions,  rashes ? ?ALLERGIES: ?No Known Allergies ? ?HOME MEDICATIONS: ? ?Current Outpatient Medications:  ?  amphetamine-dextroamphetamine (ADDERALL) 30 MG tablet, Take 30 mg by mouth 2 (two) times daily., Disp: , Rfl:  ?  calcium carbonate (OS-CAL - DOSED IN MG OF ELEMENTAL CALCIUM) 1250 (500 Ca) MG tablet, Take 1 tablet (500 mg of elemental calcium total) by mouth daily with breakfast., Disp: 30 tablet, Rfl: 2 ?  cholecalciferol (VITAMIN D3) 10 MCG (400 UNIT) TABS tablet, Take 1 tablet (400 Units total) by mouth daily., Disp: 30 tablet, Rfl: 2 ?  Lactobacillus (PROBIOTIC ACIDOPHILUS PO), Take 1 tablet by mouth daily. RAE - probiotic and prebiotic., Disp: , Rfl:  ?  loratadine (CLARITIN) 10 MG tablet, Take 10 mg by mouth daily., Disp: , Rfl:  ?  Multiple Vitamin (MULTIVITAMIN WITH MINERALS) TABS tablet, Take 1 tablet by mouth daily., Disp: , Rfl:  ?  OVER THE COUNTER MEDICATION, Take 1 tablet by mouth daily. Daily cleanse, Disp: , Rfl:  ?  Propylene Glycol (SYSTANE COMPLETE OP), Place 1 drop into both eyes daily as needed (dry eyes)., Disp: , Rfl:  ?  VRAYLAR 1.5 MG capsule, Take 1.5 mg by mouth daily., Disp: , Rfl:  ? ?PAST MEDICAL HISTORY: ?Past Medical History:  ?Diagnosis Date  ? History of ADHD   ? History of anxiety disorder   ? History of bipolar disorder   ? ? ?PAST SURGICAL HISTORY: ?Past Surgical History:  ?Procedure Laterality Date  ? NO PAST SURGERIES    ? ? ?FAMILY HISTORY: ?Family History  ?Problem Relation Age of Onset  ? ALS Father   ? Diabetes Maternal Grandmother   ? ? ?SOCIAL HISTORY: ? ?Social History  ? ?Socioeconomic History  ? Marital status: Single  ?  Spouse name: Not on file  ? Number of children: Not on file  ? Years of education: Not on file  ? Highest education level: Not on file  ?Occupational History  ? Not on file  ?Tobacco Use  ? Smoking status: Former  ?  Types: Cigarettes  ? Smokeless tobacco: Never  ?Vaping Use  ? Vaping Use: Every day  ?Substance and Sexual Activity  ? Alcohol use: Yes   ?  Comment: occ  ? Drug use: Never  ? Sexual activity: Not on file  ?Other Topics Concern  ? Not on file  ?Social History Narrative  ? Not on file  ? ?Social Determinants of Health  ? ?Financial Resource Strain: Not on file  ?Food Insecurity: Not on file  ?Transportation Needs: Not on file  ?Physical Activity: Not on file  ?Stress: Not on file  ?Social Connections: Not on file  ?Intimate Partner Violence: Not on file  ? ? ? ?PHYSICAL EXAM ? ?Vitals:  ? 04/13/21 0920  ?BP: 137/85  ?Pulse: (!) 103  ?Weight: 161 lb (73 kg)  ?Height: 5' 7"  (1.702 m)  ? ? ?Body mass index is 25.22 kg/m?. ? ?Vision Screening  ? Right eye Left eye Both eyes  ?Without correction 20/100 20/30   ?With correction     ?Comments: With both eyes could read the 20/30  vision line with exception of the right side it was blank to her.   ? ? ?General: The patient is well-developed and well-nourished and in no acute distress ? ?HEENT:  Head is [place]/AT.  Sclera are anicteric.  Funduscopic exam shows normal optic discs and retinal vessels. ? ?Neck: No carotid bruits are noted.  The neck is nontender. ? ?Cardiovascular: The heart has a regular rate and rhythm with a normal S1 and S2. There were no murmurs, gallops or rubs.   ? ?Skin: Extremities are without rash or  edema. ? ?Musculoskeletal:  Back is nontender ? ?Neurologic Exam ? ?Mental status: The patient is alert and oriented x 3 at the time of the examination. The patient has apparent normal recent and remote memory, with an apparently normal attention span and concentration ability.   Speech is normal. ? ?Cranial nerves: Extraocular movements are full.  She has a 2+ right APD.  Visual fields are full.  Facial symmetry is present. There is good facial sensation to soft touch bilaterally.Facial strength is normal.  Trapezius and sternocleidomastoid strength is normal. No dysarthria is noted.  The tongue is midline, and the patient has symmetric elevation of the soft palate. No obvious hearing deficits  are noted. ? ?Motor:  Muscle bulk is normal.   Tone is normal. Strength is  5 / 5 in all 4 extremities.  ? ?Sensory: Sensory testing is intact to pinprick, soft touch and vibration sensation in all 4 extrem

## 2021-04-16 LAB — QUANTIFERON-TB GOLD PLUS
QuantiFERON Mitogen Value: >10 IU/mL
QuantiFERON Nil Value: 0.1 IU/mL
QuantiFERON TB1 Ag Value: 0.05 IU/mL
QuantiFERON TB2 Ag Value: 0.07 IU/mL
QuantiFERON-TB Gold Plus: NEGATIVE

## 2021-04-16 LAB — ANCA PROFILE
Anti-MPO Antibodies: <0.2 units (ref 0.0–0.9)
Anti-PR3 Antibodies: <0.2 units (ref 0.0–0.9)
Atypical pANCA: <1:20 {titer}
C-ANCA: <1:20 {titer}
P-ANCA: <1:20 {titer}

## 2021-04-16 LAB — VARICELLA ZOSTER ANTIBODY, IGG: Varicella zoster IgG: 508 index (ref >165)

## 2021-04-16 LAB — HEPATITIS B SURFACE ANTIGEN: Hepatitis B Surface Ag: NEGATIVE

## 2021-04-16 LAB — HEPATITIS B CORE ANTIBODY, TOTAL: Hep B Core Total Ab: NEGATIVE

## 2021-04-16 LAB — HEPATITIS C ANTIBODY: Hep C Virus Ab: NONREACTIVE

## 2021-04-16 LAB — HEPATITIS B SURFACE ANTIBODY,QUALITATIVE: Hep B Surface Ab, Qual: REACTIVE

## 2021-04-20 NOTE — Telephone Encounter (Signed)
Received the result on the JCV antibody was negative and the index value is 0.08. ?

## 2021-04-26 ENCOUNTER — Telehealth: Payer: Self-pay | Admitting: Neurology

## 2021-04-26 NOTE — Telephone Encounter (Signed)
I went over the lab results. ? ?We discussed her treatment options.  She does have a focus within her spinal cord which places her at a higher level of aggressiveness.  We will start Tysabri 300 mg every 4 weeks.  She is JCV antibody negative. ?

## 2021-04-26 NOTE — Telephone Encounter (Signed)
Lab work shows no abnormalities.  Specifically, hepatitis, TB MJC the antibody labs where the negative or normal. ? ?Therefore, she could go on any of the medications that we had discussed at her last visit.  I was unable to leave a phone message and will try to reach her again later ?

## 2021-04-28 NOTE — Telephone Encounter (Signed)
Faxed complete/signed Tysabri start form to [facility] prescribing program at [phone]. Received fax confirmation. Gave completed start form to [facility] for them to process.  

## 2021-05-05 ENCOUNTER — Encounter: Payer: Self-pay | Admitting: Neurology

## 2021-05-05 ENCOUNTER — Encounter: Payer: Self-pay | Admitting: Family Medicine

## 2021-05-05 NOTE — Telephone Encounter (Signed)
Gave completed/signed form back to medical records to process for pt. 

## 2021-05-06 ENCOUNTER — Encounter (INDEPENDENT_AMBULATORY_CARE_PROVIDER_SITE_OTHER): Payer: Self-pay

## 2021-05-06 ENCOUNTER — Encounter: Payer: Self-pay | Admitting: Family Medicine

## 2021-05-09 MED ORDER — DESOGESTREL-ETHINYL ESTRADIOL 0.15-0.02/0.01 MG (21/5) PO TABS
1.0000 | ORAL_TABLET | Freq: Every day | ORAL | 3 refills | Status: DC
Start: 2021-05-09 — End: 2022-06-13

## 2021-06-09 ENCOUNTER — Encounter: Payer: Self-pay | Admitting: Family Medicine

## 2021-06-27 NOTE — Telephone Encounter (Signed)
Patient had her first Tysabri infusion on 06/22/2021.

## 2021-07-14 ENCOUNTER — Ambulatory Visit (INDEPENDENT_AMBULATORY_CARE_PROVIDER_SITE_OTHER): Payer: Managed Care, Other (non HMO) | Admitting: Neurology

## 2021-07-14 ENCOUNTER — Telehealth: Payer: Self-pay | Admitting: Neurology

## 2021-07-14 ENCOUNTER — Encounter: Payer: Self-pay | Admitting: Neurology

## 2021-07-14 VITALS — BP 107/79 | HR 96 | Ht 67.0 in | Wt 160.5 lb

## 2021-07-14 DIAGNOSIS — G35 Multiple sclerosis: Secondary | ICD-10-CM | POA: Diagnosis not present

## 2021-07-14 DIAGNOSIS — Z79899 Other long term (current) drug therapy: Secondary | ICD-10-CM | POA: Diagnosis not present

## 2021-07-14 DIAGNOSIS — F319 Bipolar disorder, unspecified: Secondary | ICD-10-CM

## 2021-07-14 DIAGNOSIS — H469 Unspecified optic neuritis: Secondary | ICD-10-CM

## 2021-07-14 NOTE — Telephone Encounter (Signed)
[employer] order sent to GI, I asked them to schedule for Nov or Dec they will obtain the auth and reach out to the patient to schedule.

## 2021-08-01 DIAGNOSIS — Z0289 Encounter for other administrative examinations: Secondary | ICD-10-CM

## 2021-08-03 ENCOUNTER — Telehealth: Payer: Self-pay | Admitting: Neurology

## 2021-08-03 NOTE — Telephone Encounter (Signed)
Gave completed/signed form back to medical records to process for pt. 

## 2021-08-03 NOTE — Telephone Encounter (Signed)
Received FMLA forms for patient. Patient's mother paid fee when she dropped off on 7/10 Monday. Gave to POD 1 to complete

## 2021-08-14 ENCOUNTER — Encounter: Payer: Self-pay | Admitting: Neurology

## 2021-09-18 ENCOUNTER — Encounter: Payer: Self-pay | Admitting: Family Medicine

## 2021-09-22 MED ORDER — VITAMIN D3 50 MCG (2000 UT) PO CAPS: 4000.0000 [IU] | ORAL_CAPSULE | Freq: Every day | ORAL | 3 refills | Status: DC

## 2021-10-12 ENCOUNTER — Telehealth: Payer: Self-pay | Admitting: *Deleted

## 2021-10-12 ENCOUNTER — Other Ambulatory Visit (INDEPENDENT_AMBULATORY_CARE_PROVIDER_SITE_OTHER): Payer: Self-pay

## 2021-10-12 ENCOUNTER — Other Ambulatory Visit: Payer: Self-pay | Admitting: *Deleted

## 2021-10-12 DIAGNOSIS — G35 Multiple sclerosis: Secondary | ICD-10-CM

## 2021-10-12 DIAGNOSIS — Z0289 Encounter for other administrative examinations: Secondary | ICD-10-CM

## 2021-10-12 DIAGNOSIS — Z79899 Other long term (current) drug therapy: Secondary | ICD-10-CM

## 2021-10-12 NOTE — Telephone Encounter (Signed)
Placed [facility] lab in [facility] lock box for routine lab pick up. Results pending. 

## 2021-10-13 LAB — CBC WITH DIFFERENTIAL/PLATELET
Basophils Absolute: 0 10*3/uL (ref 0.0–0.2)
Basos: 1 %
EOS (ABSOLUTE): 0.1 10*3/uL (ref 0.0–0.4)
Eos: 1 %
Hematocrit: 38.2 % (ref 34.0–46.6)
Hemoglobin: 12.9 g/dL (ref 11.1–15.9)
Immature Grans (Abs): 0.1 10*3/uL (ref 0.0–0.1)
Immature Granulocytes: 1 %
Lymphocytes Absolute: 2.8 10*3/uL (ref 0.7–3.1)
Lymphs: 37 %
MCH: 30.5 pg (ref 26.6–33.0)
MCHC: 33.8 g/dL (ref 31.5–35.7)
MCV: 90 fL (ref 79–97)
Monocytes Absolute: 0.6 10*3/uL (ref 0.1–0.9)
Monocytes: 8 %
NRBC: 1 % — ABNORMAL HIGH (ref 0–0)
Neutrophils Absolute: 3.9 10*3/uL (ref 1.4–7.0)
Neutrophils: 52 %
Platelets: 374 10*3/uL (ref 150–450)
RBC: 4.23 x10E6/uL (ref 3.77–5.28)
RDW: 12.7 % (ref 11.7–15.4)
WBC: 7.6 10*3/uL (ref 3.4–10.8)

## 2021-10-18 NOTE — Telephone Encounter (Signed)
JCV ab drawn on 10/12/21 negative, index 0.08.

## 2021-12-23 ENCOUNTER — Encounter: Payer: Self-pay | Admitting: Neurology

## 2021-12-27 ENCOUNTER — Ambulatory Visit
Admission: RE | Admit: 2021-12-27 | Discharge: 2021-12-27 | Disposition: A | Discharge status: Home / Self Care | Payer: Managed Care, Other (non HMO) | Source: Ambulatory Visit | Attending: Neurology | Admitting: Neurology

## 2021-12-27 DIAGNOSIS — G35 Multiple sclerosis: Secondary | ICD-10-CM | POA: Diagnosis not present

## 2021-12-27 MED ORDER — GADOBENATE DIMEGLUMINE 529 MG/ML IV SOLN
15.0000 mL | Freq: Once | INTRAVENOUS | Status: AC | PRN
  Administered 2021-12-27: 15 mL via INTRAVENOUS

## 2022-01-17 ENCOUNTER — Ambulatory Visit (INDEPENDENT_AMBULATORY_CARE_PROVIDER_SITE_OTHER): Payer: Managed Care, Other (non HMO) | Admitting: Neurology

## 2022-01-17 ENCOUNTER — Encounter: Payer: Self-pay | Admitting: Neurology

## 2022-01-17 VITALS — BP 111/77 | HR 108 | Ht 67.0 in | Wt 179.0 lb

## 2022-01-17 DIAGNOSIS — H469 Unspecified optic neuritis: Secondary | ICD-10-CM

## 2022-01-17 DIAGNOSIS — G35 Multiple sclerosis: Secondary | ICD-10-CM | POA: Diagnosis not present

## 2022-01-17 DIAGNOSIS — Z79899 Other long term (current) drug therapy: Secondary | ICD-10-CM

## 2022-01-17 DIAGNOSIS — G35D Multiple sclerosis, unspecified: Secondary | ICD-10-CM

## 2022-01-17 NOTE — Progress Notes (Signed)
[facility]  PATIENT: Cindy MOSELY DOB: June 26, 1985  REFERRING DOCTOR OR PCP:  Loura Pardon, MD SOURCE: Patient, notes from recent hospitalization, imaging and lab reports, MRI images personally reviewed.  _________________________________   HISTORICAL  CHIEF COMPLAINT:  Chief Complaint  Patient presents with   Follow-up    Pt in room #10 with mother.  Pt here today to f/u with her MS.    HISTORY OF PRESENT ILLNESS:  Cindy Summers is a 36 y.o. woman with MS  Update 01/17/2022: She is on Tysabri and tolerates it well.   No exacerbation or new MS symptom.  MR 12/27/2021 was stable     Her vision is much better.   The right eye vision is slightly more blurry than left but color vision is now symmetric and she is much better than 2-3 months ago.       Gait and balance are doing well.   Does not need bannister on stairs.      She denies weakness.   She has mild finger tingling.   She has minimal urinary urgency not bad enough to treat.     Mood is stable,   She has biploar disorder.   She used to be on lamotrigine.  Vraylar was just started.   She is also on Adderall.   She sleeps well most nights  though often wakes up once in middle of night.    Cognition is doing well.      She is experiencing headaches in the occiput and temple region.  These occur once or twice a week.  MS HISTORY: She had the onset of right visual changes 03/27/2021 at age 36.   She was watching TV and noted visual changes like a smudge and darkening of her glasses.   The next day, there seemed to be a dark glare out of the right eye.   She then noted that she could not see out of the right eye.   She went to [facility] 03/30/2021 and was diagnosed with right otic neuritis and told to go to the ED for steroids and further evaluation.   On her examination she had reduced inferior visual field and central vision.   She could count fingers but not read out of that eye.   She received IV steroids and had  imaging studies.   MRI of the brain showed foci c/w MS and she also had enhancement of the right optic nerve.    The next day, MRI of the spie showed a single focus just below the cervicomedullary junction.   She felt some improvement a day after the first steroid infusion and was seeing better upon discharge at 5 days.       In retrospect she has sometimes felt clumsy at times but never had a period > 24 hours of clumsiness, weakness or numbness.     Data Imaging personally reviewed: MRI of the brain and orbits 03/30/2021 shows scattered T2/FLAIR hyperintense foci in the periventricular and juxtacortical white matter.  There is enhancement of the prechiasmatic right optic nerve.  This is consistent with acute optic neuritis and chronic demyelination consistent with MS.  MRI of the cervical and thoracic spine 03/31/2021 shows a T2/flair hyperintense focus at the cervicomedullary junction to the right (maybe smaller one to left medulla)consistent with demyelination.  Disc protrusion at C5-C6 that does not cause nerve root compression or spinal stenosis.  No abnormal enhancement.  MRI of the brain 12/27/2021 T2/FLAIR hyperintense  foci in the periventricular and juxtacortical white matter of the cerebral hemispheres in a pattern consistent with chronic demyelinating plaque associated with multiple sclerosis. None of the foci appear to be acute. They do not enhance. Compared to the MRI from 03/30/2021, there are no new lesions.   Laboratory March 2023: ESR, CRP, neuromyelitis optica antibody, anti-MOG antibody, HIV, hepatitis C were negative or normal.  TSH was nornal  REVIEW OF SYSTEMS: Constitutional: No fevers, chills, sweats, or change in appetite Eyes: No visual changes, double vision, eye pain Ear, nose and throat: No hearing loss, ear pain, nasal congestion, sore throat Cardiovascular: No chest pain, palpitations Respiratory:  No shortness of breath at rest or with exertion.   No  wheezes GastrointestinaI: No nausea, vomiting, diarrhea, abdominal pain, fecal incontinence Genitourinary:  No dysuria, urinary retention or frequency.  No nocturia. Musculoskeletal:  No neck pain, back pain Integumentary: No rash, pruritus, skin lesions Neurological: as above Psychiatric: No depression at this time.  No anxiety Endocrine: No palpitations, diaphoresis, change in appetite, change in weigh or increased thirst Hematologic/Lymphatic:  No anemia, purpura, petechiae. Allergic/Immunologic: No itchy/runny eyes, nasal congestion, recent allergic reactions, rashes  ALLERGIES: No Known Allergies  HOME MEDICATIONS:  Current Outpatient Medications:    amphetamine-dextroamphetamine (ADDERALL) 20 MG tablet, Take 20 mg by mouth daily., Disp: , Rfl:    amphetamine-dextroamphetamine (ADDERALL) 30 MG tablet, Take 30 mg by mouth 2 (two) times daily., Disp: , Rfl:    Cholecalciferol (VITAMIN D3) 50 MCG (2000 UT) capsule, Take 2 capsules (4,000 Units total) by mouth daily., Disp: 90 capsule, Rfl: 3   desogestrel-ethinyl estradiol (AZURETTE) 0.15-0.02/0.01 MG (21/5) tablet, Take 1 tablet by mouth daily., Disp: 84 tablet, Rfl: 3   loratadine (CLARITIN) 10 MG tablet, Take 10 mg by mouth daily., Disp: , Rfl:    natalizumab (TYSABRI) 300 MG/15ML injection, Inject 300 mg into the vein every 28 (twenty-eight) days., Disp: , Rfl:    Propylene Glycol (SYSTANE COMPLETE OP), Place 1 drop into both eyes daily as needed (dry eyes)., Disp: , Rfl:    VRAYLAR 1.5 MG capsule, Take 1.5 mg by mouth daily., Disp: , Rfl:   PAST MEDICAL HISTORY: Past Medical History:  Diagnosis Date   History of ADHD    History of anxiety disorder    History of bipolar disorder     PAST SURGICAL HISTORY: Past Surgical History:  Procedure Laterality Date   NO PAST SURGERIES      FAMILY HISTORY: Family History  Problem Relation Age of Onset   ALS Father    Diabetes Maternal Grandmother     SOCIAL HISTORY:  Social  History   Socioeconomic History   Marital status: Single    Spouse name: Not on file   Number of children: Not on file   Years of education: Not on file   Highest education level: Not on file  Occupational History   Not on file  Tobacco Use   Smoking status: Former    Types: Cigarettes   Smokeless tobacco: Never  Vaping Use   Vaping Use: Every day  Substance and Sexual Activity   Alcohol use: Yes    Comment: occ   Drug use: Never   Sexual activity: Not on file  Other Topics Concern   Not on file  Social History Narrative   Not on file   Social Determinants of Health   Financial Resource Strain: Not on file  Food Insecurity: Not on file  Transportation Needs: Not on file  Physical Activity: Not on file  Stress: Not on file  Social Connections: Not on file  Intimate Partner Violence: Not on file     PHYSICAL EXAM  Vitals:   01/17/22 1137  BP: 111/77  Pulse: (!) 108  Weight: 179 lb (81.2 kg)  Height: _0  (1.702 m)    Body mass index is 28.04 kg/m.  No results found.   General: The patient is well-developed and well-nourished and in no acute distress  HEENT:  Head is Bruning/AT.  Sclera are anicteric.    Neck: Normal range of motion.    Skin: Extremities are without rash or  edema.  Musculoskeletal:  Back is nontender  Neurologic Exam  Mental status: The patient is alert and oriented x 3 at the time of the examination. The patient has apparent normal recent and remote memory, with an apparently normal attention span and concentration ability.   Speech is normal.  Cranial nerves: Extraocular movements are full.  She has a 2+ right APD.  Visual fields are full.  Facial symmetry is present. There is good facial sensation to soft touch bilaterally.Facial strength is normal.  Trapezius and sternocleidomastoid strength is normal. No dysarthria is noted.   No obvious hearing deficits are noted.  Motor:  Muscle bulk is normal.   Tone is normal. Strength is  5 /  5 in all 4 extremities.   Sensory: Sensory testing is intact to pinprick, soft touch and vibration sensation in all 4 extremities.  Coordination: Cerebellar testing reveals good finger-nose-finger and heel-to-shin bilaterally.  Gait and station: Station is normal.   Gait is normal. Tandem gait is normal. Romberg is negative.   Reflexes: Deep tendon reflexes are symmetric and normal bilaterally.       DIAGNOSTIC DATA (LABS, IMAGING, TESTING) - I reviewed patient records, labs, notes, testing and imaging myself where available.  Lab Results  Component Value Date   WBC 7.6 10/12/2021   HGB 12.9 10/12/2021   HCT 38.2 10/12/2021   MCV 90 10/12/2021   PLT 374 10/12/2021      Component Value Date/Time   NA 139 03/31/2021 0333   K 3.5 03/31/2021 0333   CL 107 03/31/2021 0333   CO2 23 03/31/2021 0333   GLUCOSE 86 03/31/2021 0333   BUN 12 03/31/2021 0333   CREATININE 0.63 03/31/2021 0333   CALCIUM 8.9 03/31/2021 0333   PROT 6.3 (L) 03/31/2021 0333   ALBUMIN 3.7 03/31/2021 0333   AST 16 03/31/2021 0333   ALT 12 03/31/2021 0333   ALKPHOS 53 03/31/2021 0333   BILITOT 0.7 03/31/2021 0333   GFRNONAA >60 03/31/2021 0333   Lab Results  Component Value Date   CHOL 148 04/05/2021   HDL 67.40 04/05/2021   LDLDIRECT 58.0 04/05/2021   TRIG 212.0 (H) 04/05/2021   CHOLHDL 2 04/05/2021    Lab Results  Component Value Date   TSH 0.40 04/05/2021       ASSESSMENT AND PLAN  Multiple sclerosis (HCC)  High risk medication use  Optic neuritis, right; multiple sclerosis  Continue Tysabri.  We will check the JCV antibody about every 6 months.   If bladder issues worsen will consider Oxybutynin or other med.  3.   If headaches worsen, consider trigger point injection or adding tricyclic or or zonisamide/Topamax. 4.   Return in 6 months or sooner if there are new or worsening neurologic symptoms.   Christie Copley A. Felecia Shelling, MD, [facility] 15/17/6160, 7:37 PM Certified in Neurology,  Clinical Neurophysiology, Sleep Medicine  and Neuroimaging  [facility] [address] [place], [place] [address] [phone]

## 2022-02-10 ENCOUNTER — Other Ambulatory Visit: Payer: Self-pay | Admitting: Family Medicine

## 2022-04-03 ENCOUNTER — Encounter: Payer: Self-pay | Admitting: Family Medicine

## 2022-04-03 MED ORDER — VITAMIN D3 50 MCG (2000 UT) PO CAPS: 4000.0000 [IU] | ORAL_CAPSULE | Freq: Every day | ORAL | 1 refills | Status: DC

## 2022-05-23 ENCOUNTER — Other Ambulatory Visit: Payer: Self-pay | Admitting: *Deleted

## 2022-05-23 ENCOUNTER — Telehealth: Payer: Self-pay | Admitting: Family Medicine

## 2022-05-23 DIAGNOSIS — Z0000 Encounter for general adult medical examination without abnormal findings: Secondary | ICD-10-CM

## 2022-05-23 DIAGNOSIS — Z79899 Other long term (current) drug therapy: Secondary | ICD-10-CM

## 2022-05-23 DIAGNOSIS — G35 Multiple sclerosis: Secondary | ICD-10-CM

## 2022-05-23 NOTE — Telephone Encounter (Signed)
-----   Message from Terri J Walsh sent at 05/02/2022  3:43 PM EDT ----- Regarding: Lab orders for Wednesday, 5.1.24 Patient is scheduled for CPX labs, please order future labs, Thanks , Terri   

## 2022-05-24 ENCOUNTER — Other Ambulatory Visit (INDEPENDENT_AMBULATORY_CARE_PROVIDER_SITE_OTHER): Payer: Managed Care, Other (non HMO)

## 2022-05-24 DIAGNOSIS — Z0000 Encounter for general adult medical examination without abnormal findings: Secondary | ICD-10-CM

## 2022-05-24 NOTE — Addendum Note (Signed)
Addended by: Fatima Fedie S on: 05/24/2022 08:40 AM   Modules accepted: Orders  

## 2022-05-24 NOTE — Addendum Note (Signed)
Addended by: Sanayah Munro S on: 05/24/2022 08:40 AM   Modules accepted: Orders  

## 2022-05-24 NOTE — Addendum Note (Signed)
Addended by: Kamarion Zagami S on: 05/24/2022 08:40 AM   Modules accepted: Orders  

## 2022-05-24 NOTE — Addendum Note (Signed)
Addended by: Damita Lack on: 05/24/2022 08:40 AM   Modules accepted: Orders

## 2022-05-25 LAB — CBC WITH DIFFERENTIAL/PLATELET
Basophils Absolute: 0.1 10*3/uL (ref 0.0–0.2)
Basos: 1 %
EOS (ABSOLUTE): 0.1 10*3/uL (ref 0.0–0.4)
Eos: 2 %
Hematocrit: 42.8 % (ref 34.0–46.6)
Hemoglobin: 14.1 g/dL (ref 11.1–15.9)
Immature Grans (Abs): 0.1 10*3/uL (ref 0.0–0.1)
Immature Granulocytes: 1 %
Lymphocytes Absolute: 4.2 10*3/uL — ABNORMAL HIGH (ref 0.7–3.1)
Lymphs: 42 %
MCH: 30.3 pg (ref 26.6–33.0)
MCHC: 32.9 g/dL (ref 31.5–35.7)
MCV: 92 fL (ref 79–97)
Monocytes Absolute: 0.9 10*3/uL (ref 0.1–0.9)
Monocytes: 10 %
NRBC: 1 % — ABNORMAL HIGH (ref 0–0)
Neutrophils Absolute: 4.2 10*3/uL (ref 1.4–7.0)
Neutrophils: 44 %
Platelets: 340 10*3/uL (ref 150–450)
RBC: 4.66 x10E6/uL (ref 3.77–5.28)
RDW: 12.6 % (ref 11.7–15.4)
WBC: 9.6 10*3/uL (ref 3.4–10.8)

## 2022-05-25 LAB — LIPID PANEL
Chol/HDL Ratio: 3.1 ratio (ref 0.0–4.4)
Cholesterol, Total: 185 mg/dL (ref 100–199)
HDL: 59 mg/dL (ref >39)
LDL Chol Calc (NIH): 108 mg/dL — ABNORMAL HIGH (ref 0–99)
Triglycerides: 100 mg/dL (ref 0–149)
VLDL Cholesterol Cal: 18 mg/dL (ref 5–40)

## 2022-05-25 LAB — COMPREHENSIVE METABOLIC PANEL
ALT: 15 IU/L (ref 0–32)
AST: 18 IU/L (ref 0–40)
Albumin/Globulin Ratio: 1.8 (ref 1.2–2.2)
Albumin: 4.5 g/dL (ref 3.9–4.9)
Alkaline Phosphatase: 95 IU/L (ref 44–121)
BUN/Creatinine Ratio: 28 — ABNORMAL HIGH (ref 9–23)
BUN: 23 mg/dL — ABNORMAL HIGH (ref 6–20)
Bilirubin Total: 0.4 mg/dL (ref 0.0–1.2)
CO2: 20 mmol/L (ref 20–29)
Calcium: 9.7 mg/dL (ref 8.7–10.2)
Chloride: 101 mmol/L (ref 96–106)
Creatinine, Ser: 0.83 mg/dL (ref 0.57–1.00)
Globulin, Total: 2.5 g/dL (ref 1.5–4.5)
Glucose: 89 mg/dL (ref 70–99)
Potassium: 5 mmol/L (ref 3.5–5.2)
Sodium: 139 mmol/L (ref 134–144)
Total Protein: 7 g/dL (ref 6.0–8.5)
eGFR: 94 mL/min/{1.73_m2} (ref >59)

## 2022-05-25 LAB — TSH: TSH: 1.69 u[IU]/mL (ref 0.450–4.500)

## 2022-05-31 ENCOUNTER — Ambulatory Visit: Payer: Managed Care, Other (non HMO) | Admitting: Family Medicine

## 2022-05-31 ENCOUNTER — Encounter: Payer: Self-pay | Admitting: Family Medicine

## 2022-05-31 VITALS — BP 104/64 | HR 110 | Temp 98.1°F | Ht 67.0 in | Wt 172.5 lb

## 2022-05-31 DIAGNOSIS — B379 Candidiasis, unspecified: Secondary | ICD-10-CM | POA: Diagnosis not present

## 2022-05-31 DIAGNOSIS — F900 Attention-deficit hyperactivity disorder, predominantly inattentive type: Secondary | ICD-10-CM

## 2022-05-31 DIAGNOSIS — F319 Bipolar disorder, unspecified: Secondary | ICD-10-CM | POA: Diagnosis not present

## 2022-05-31 DIAGNOSIS — G35 Multiple sclerosis: Secondary | ICD-10-CM

## 2022-05-31 DIAGNOSIS — G35A Relapsing-remitting multiple sclerosis: Secondary | ICD-10-CM | POA: Insufficient documentation

## 2022-05-31 DIAGNOSIS — M25473 Effusion, unspecified ankle: Secondary | ICD-10-CM

## 2022-05-31 LAB — POCT WET PREP (WET MOUNT): Trichomonas Wet Prep HPF POC: ABSENT

## 2022-05-31 MED ORDER — FLUCONAZOLE 150 MG PO TABS: ORAL_TABLET | ORAL | 0 refills | Status: DC

## 2022-05-31 NOTE — Patient Instructions (Addendum)
Be gentle to the genital area- avoid harsh detergents and hot water  No bath or hot tub until this is better   Avoid any topical products for now   Keep as dry as possible   Take the diflucan every 3 days for three doses  Update if not starting to improve in a week or if worsening    For swollen ankles Avoid processed salty food  Every chance you can-elevate your feet  Change work position frequently through the day

## 2022-05-31 NOTE — Progress Notes (Signed)
Subjective:    Patient ID: Cindy Summers, female    DOB: July 30, 1985, 37 y.o.   MRN: [id number]  HPI Here for c/o vaginal discomfort  Some ankle swelling   Wt Readings from Last 3 Encounters:  05/31/22 172 lb 8 oz (78.2 kg)  01/17/22 179 lb (81.2 kg)  07/14/21 160 lb 8 oz (72.8 kg)   27.02 kg/m  Vitals:   05/31/22 1116  BP: 104/64  Pulse: (!) 110  Temp: 98.1 F (36.7 C)  SpO2: 96%    Working Feeling ok overall   Had some hemorrhoid problems  Tuks and prep H   Thinks she may have a yeast infection - some vulvar swelling and pain  Thought she had a cut (? 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    Some acne   Had a sharp pain behind R ear- lasted 2-3 d  Gone now    Results for orders placed or performed in visit on 05/31/22  POCT Wet Prep [employer] [facility])  Result Value Ref Range   Source Wet Prep POC vaginal    WBC, Wet Prep HPF POC mod    Bacteria Wet Prep HPF POC Moderate (A) Few   BACTERIA WET PREP MORPHOLOGY POC     Clue Cells Wet Prep HPF POC None None   Clue Cells Wet Prep Whiff POC     Yeast Wet Prep HPF POC Many (A) None   KOH Wet Prep POC Many (A) None   Trichomonas Wet Prep HPF POC Absent Absent     Immunization History  Administered Date(s) Administered   Influenza,inj,Quad PF,6+ Mos 12/02/2020   PFIZER(Purple Top)SARS-COV-2 Vaccination 05/12/2019, 06/03/2019, 03/03/2020   Health Maintenance Due  Topic Date Due   DTaP/Tdap/Td (1 - Tdap) Never done   COVID-19 Vaccine (4 - 2023-24 season) 09/23/2021    Pap 03/2021 normal with neg HPV and neg STD screen  Not sexually active in the past year Has never had vaginal HSV but does get cold sores     Neg hep C screening also  Lab Results  Component Value Date   HEPBCAB Negative 04/13/2021   HEPCAB NON-REACTIVE 04/05/2021   On OC in the  past  Menses-regular  Not sexually active    Takes ca and D for bone health      Mood Under care of psychiatry for bipolar and ADD Takes adderall Was on vraylar and lamictal- she stopped them    H/o optic neuritis with MS  Taking tysabri -it is working well (she looked it up and a side effect is vaginitis)  Sees neurology Dr Epimenio Foot  Discussed headaches at last visit there and discussed trigger pt inj or tricyclic or topamax    Labs Last metabolic panel Lab Results  Component Value Date   GLUCOSE 89 05/24/2022   NA 139 05/24/2022   K 5.0 05/24/2022   CL 101 05/24/2022   CO2 20 05/24/2022   BUN 23 (H) 05/24/2022   CREATININE 0.83 05/24/2022   EGFR 94 05/24/2022   CALCIUM 9.7 05/24/2022   PROT 7.0 05/24/2022   ALBUMIN 4.5 05/24/2022   LABGLOB 2.5 05/24/2022   AGRATIO 1.8 05/24/2022   BILITOT 0.4 05/24/2022   ALKPHOS 95 05/24/2022   AST 18 05/24/2022   ALT 15 05/24/2022  ANIONGAP 9 03/31/2021   GFR of 94  Lab Results  Component Value Date   WBC 9.6 05/24/2022   HGB 14.1 05/24/2022   HCT 42.8 05/24/2022   MCV 92 05/24/2022   PLT 340 05/24/2022   Lab Results  Component Value Date   TSH 1.690 05/24/2022   Cholesterol Lab Results  Component Value Date   CHOL 185 05/24/2022   CHOL 148 04/05/2021   Lab Results  Component Value Date   HDL 59 05/24/2022   HDL 67.40 04/05/2021   Lab Results  Component Value Date   LDLCALC 108 (H) 05/24/2022   Lab Results  Component Value Date   TRIG 100 05/24/2022   TRIG 212.0 (H) 04/05/2021   Lab Results  Component Value Date   CHOLHDL 3.1 05/24/2022   CHOLHDL 2 04/05/2021   Lab Results  Component Value Date   LDLDIRECT 58.0 04/05/2021   Patient Active Problem List   Diagnosis Date Noted   Multiple sclerosis (HCC) 05/31/2022   Yeast infection 05/31/2022   Visit for routine gyn exam 04/05/2021   Optic neuritis, right; multiple sclerosis 03/30/2021   Allergic rhinitis 03/30/2021   Routine general  medical examination at a health care facility 03/07/2021   Vapes nicotine containing substance 12/02/2020   Menstrual problem 12/02/2020   Bipolar affective disorder (HCC) 08/11/2008   Attention deficit hyperactivity disorder (ADHD) 08/11/2008   Past Medical History:  Diagnosis Date   History of ADHD    History of anxiety disorder    History of bipolar disorder    Past Surgical History:  Procedure Laterality Date   NO PAST SURGERIES     Social History   Tobacco Use   Smoking status: Former    Types: Cigarettes   Smokeless tobacco: Never   Tobacco comments:    vapes  Vaping Use   Vaping Use: Every day  Substance Use Topics   Alcohol use: Yes    Comment: occ   Drug use: Yes    Types: Marijuana   Family History  Problem Relation Age of Onset   ALS Father    Diabetes Maternal Grandmother    No Known Allergies Current Outpatient Medications on File Prior to Visit  Medication Sig Dispense Refill   amphetamine-dextroamphetamine (ADDERALL) 20 MG tablet Take 20 mg by mouth daily.     amphetamine-dextroamphetamine (ADDERALL) 30 MG tablet Take 30 mg by mouth 2 (two) times daily.     Cholecalciferol (VITAMIN D3) 50 MCG (2000 UT) CAPS Take 2 capsules (4,000 Units total) by mouth daily. 180 capsule 1   loratadine (CLARITIN) 10 MG tablet Take 10 mg by mouth daily.     natalizumab (TYSABRI) 300 MG/15ML injection Inject 300 mg into the vein every 28 (twenty-eight) days.     Propylene Glycol (SYSTANE COMPLETE OP) Place 1 drop into both eyes daily as needed (dry eyes).     desogestrel-ethinyl estradiol (AZURETTE) 0.15-0.02/0.01 MG (21/5) tablet Take 1 tablet by mouth daily. (Patient not taking: Reported on 05/31/2022) 84 tablet 3   VRAYLAR 1.5 MG capsule Take 1.5 mg by mouth daily. (Patient not taking: Reported on 05/31/2022)     No current facility-administered medications on file prior to visit.     Review of Systems  Constitutional:  Negative for activity change, appetite change,  fatigue, fever and unexpected weight change.  HENT:  Negative for congestion, ear pain, rhinorrhea, sinus pressure and sore throat.   Eyes:  Negative for pain, redness and visual disturbance.  Respiratory:  Negative  for cough, shortness of breath and wheezing.   Cardiovascular:  Positive for leg swelling. Negative for chest pain and palpitations.  Gastrointestinal:  Negative for abdominal pain, blood in stool, constipation and diarrhea.  Endocrine: Negative for polydipsia and polyuria.  Genitourinary:  Positive for vaginal discharge and vaginal pain. Negative for dysuria, frequency, menstrual problem, urgency and vaginal bleeding.  Musculoskeletal:  Negative for arthralgias, back pain and myalgias.  Skin:  Negative for pallor and rash.  Allergic/Immunologic: Negative for environmental allergies.  Neurological:  Negative for dizziness, syncope, weakness and headaches.  Hematological:  Negative for adenopathy. Does not bruise/bleed easily.  Psychiatric/Behavioral:  Negative for decreased concentration and dysphoric mood. The patient is not nervous/anxious.        Bipolar stable        Objective:   Physical Exam Constitutional:      General: She is not in acute distress.    Appearance: Normal appearance. She is normal weight. She is not ill-appearing.  Cardiovascular:     Rate and Rhythm: Normal rate and regular rhythm.     Pulses: Normal pulses.     Heart sounds: Normal heart sounds.  Pulmonary:     Effort: No respiratory distress.     Breath sounds: No wheezing.  Abdominal:     Comments: No suprapubic tenderness or fullness    Genitourinary:    Comments: Erythema of labia minora and majora with some swelling This extends to anal area No breakdown or lesions or vesicles Mild white discharge No odor  Musculoskeletal:        General: No swelling.     Right lower leg: No edema.     Left lower leg: No edema.  Neurological:     Mental Status: She is alert.            Assessment & Plan:   Problem List Items Addressed This Visit       Nervous and Auditory   Multiple sclerosis (HCC)    Doing better Reviewed her last neuro note from Dr Epimenio Foot Taking tysabri and tolerating it  Reviewed side effects of medication- vaginitis is one of them and she does have a yeast infx today        Other   Ankle swelling    Dependent  At end of the day  Works at home now standing and sitting still Discussed elevation when able Watch dietary sodium  Move more during the day  Labs are reassuring       Attention deficit hyperactivity disorder (ADHD)    Continues adderral 30 mg bid and 20 mg in afternoon prn Under care of psychiatry Stable overall Pt is working at home      Bipolar affective disorder (HCC)    Pt has stopped her lamictal and vraylar  Thinks she is stable Encouraged to let her psychiatrist know she has stopped medicines       Yeast infection - Primary    Vaginal- external and internal Erythema extends to anal area as well   Wet prep noted hyphae Discussed therapeutic opt  Will try diflucan 150 mg - one dose every 3 d for 3 doses  Update if not starting to improve in a week or if worsening  Encouraged to avoid topical products/ keep clean and dry  Noted poss side effects of her MS medication tsabri as well   Update if not starting to improve in a week or if worsening        Relevant Medications  fluconazole (DIFLUCAN) 150 MG tablet   Other Relevant Orders   POCT Wet Prep [facility]) (Completed)

## 2022-05-31 NOTE — Assessment & Plan Note (Signed)
Dependent  At end of the day  Works at home now standing and sitting still Discussed elevation when able Watch dietary sodium  Move more during the day  Labs are reassuring

## 2022-05-31 NOTE — Assessment & Plan Note (Signed)
Pt has stopped her lamictal and vraylar  Thinks she is stable Encouraged to let her psychiatrist know she has stopped medicines

## 2022-05-31 NOTE — Assessment & Plan Note (Signed)
Vaginal- external and internal Erythema extends to anal area as well   Wet prep noted hyphae Discussed therapeutic opt  Will try diflucan 150 mg - one dose every 3 d for 3 doses  Update if not starting to improve in a week or if worsening  Encouraged to avoid topical products/ keep clean and dry  Noted poss side effects of her MS medication tsabri as well   Update if not starting to improve in a week or if worsening

## 2022-05-31 NOTE — Assessment & Plan Note (Signed)
Doing better Reviewed her last neuro note from Dr Epimenio Foot Taking tysabri and tolerating it  Reviewed side effects of medication- vaginitis is one of them and she does have a yeast infx today

## 2022-05-31 NOTE — Assessment & Plan Note (Signed)
Continues adderral 30 mg bid and 20 mg in afternoon prn Under care of psychiatry Stable overall Pt is working at home

## 2022-06-07 ENCOUNTER — Other Ambulatory Visit: Payer: Self-pay

## 2022-06-07 ENCOUNTER — Telehealth: Payer: Self-pay | Admitting: *Deleted

## 2022-06-07 DIAGNOSIS — Z79899 Other long term (current) drug therapy: Secondary | ICD-10-CM

## 2022-06-07 DIAGNOSIS — G35 Multiple sclerosis: Secondary | ICD-10-CM

## 2022-06-07 NOTE — Telephone Encounter (Signed)
Placed [facility] lab in [facility] lock box for routine lab pick up. Results pending. 

## 2022-06-08 LAB — CBC WITH DIFFERENTIAL/PLATELET
Basophils Absolute: 0 10*3/uL (ref 0.0–0.2)
Basos: 1 %
EOS (ABSOLUTE): 0.2 10*3/uL (ref 0.0–0.4)
Eos: 2 %
Hematocrit: 38.4 % (ref 34.0–46.6)
Hemoglobin: 12.6 g/dL (ref 11.1–15.9)
Immature Grans (Abs): 0.1 10*3/uL (ref 0.0–0.1)
Immature Granulocytes: 1 %
Lymphocytes Absolute: 3.6 10*3/uL — ABNORMAL HIGH (ref 0.7–3.1)
Lymphs: 47 %
MCH: 29.3 pg (ref 26.6–33.0)
MCHC: 32.8 g/dL (ref 31.5–35.7)
MCV: 89 fL (ref 79–97)
Monocytes Absolute: 0.8 10*3/uL (ref 0.1–0.9)
Monocytes: 10 %
Neutrophils Absolute: 3 10*3/uL (ref 1.4–7.0)
Neutrophils: 39 %
Platelets: 287 10*3/uL (ref 150–450)
RBC: 4.3 x10E6/uL (ref 3.77–5.28)
RDW: 12.3 % (ref 11.7–15.4)
WBC: 7.6 10*3/uL (ref 3.4–10.8)

## 2022-06-12 NOTE — Telephone Encounter (Signed)
Per Dr. Epimenio Foot, he would like pt to come in for appt to discuss other MS DMT options.  I called pt. Offered appt 06/13/22 at 2pm with Dr. Epimenio Foot. She accepted. I scheduled pt.

## 2022-06-12 NOTE — Telephone Encounter (Signed)
JCV ab drawn on 06/07/22 positive, index: 2.48. Gave to MD for review.

## 2022-06-13 ENCOUNTER — Encounter: Payer: Self-pay | Admitting: Neurology

## 2022-06-13 ENCOUNTER — Ambulatory Visit: Payer: Managed Care, Other (non HMO) | Admitting: Neurology

## 2022-06-13 VITALS — BP 120/81 | HR 109 | Ht 67.0 in | Wt 175.0 lb

## 2022-06-13 DIAGNOSIS — F319 Bipolar disorder, unspecified: Secondary | ICD-10-CM

## 2022-06-13 DIAGNOSIS — H469 Unspecified optic neuritis: Secondary | ICD-10-CM

## 2022-06-13 DIAGNOSIS — Z79899 Other long term (current) drug therapy: Secondary | ICD-10-CM | POA: Diagnosis not present

## 2022-06-13 DIAGNOSIS — G35 Multiple sclerosis: Secondary | ICD-10-CM | POA: Diagnosis not present

## 2022-06-13 NOTE — Progress Notes (Signed)
[facility]  PATIENT: Cindy Summers DOB: 1985-10-24  REFERRING DOCTOR OR PCP:  Roxy Manns, MD SOURCE: Patient, notes from recent hospitalization, imaging and lab reports, MRI images personally reviewed.  _________________________________   HISTORICAL  CHIEF COMPLAINT:  Chief Complaint  Patient presents with   Follow-up    Pt in room 11. Here to discuss labs and discuss infusion.     HISTORY OF PRESENT ILLNESS:  Cindy Summers is a 37 y.o. woman with MS  Update 06/13/2022: She is on Tysabri and tolerates it well.   Unfortunately, she has become JCV antibody positive (was 2.48 when recently checked).  Therefore she needs a different disease modifying therapy.  No exacerbation or new MS symptom.  MRI 12/27/2021 was stable   Her next Tysabri was scheduled for 07/05/2022.  We spent most of the visit discussing treatment options.  Gait and balance are doing well.   She does not need to use the bannister on stairs.      She denies weakness.   She has mild finger tingling.   She has minimal urinary urgency not bad enough to treat.   Vision has returned to baseline right ON was presenting symptom).  Mood is stable,   She has bipolar disorder.   She used to be on lamotrigine.  Vraylar was just started.   She is also on Adderall.   She sleeps well most nights  though often wakes up once in middle of night.    Cognition is doing well.      She is experiencing headaches in the occiput and temple region.  These occur once or twice a week.   She had one HA lasting 2 days.   The more severe headaches are often shock like at the occiput.  MS HISTORY: She had the onset of right visual changes 03/27/2021 at age 45.   She was watching TV and noted visual changes like a smudge and darkening of her glasses.   The next day, there seemed to be a dark glare out of the right eye.   She then noted that she could not see out of the right eye.   She went to [facility]/30/2021 and was diagnosed with  right otic neuritis and told to go to the ED for steroids and further evaluation.   On her examination she had reduced inferior visual field and central vision.   She could count fingers but not read out of that eye.   She received IV steroids and had imaging studies.   MRI of the brain showed foci c/w MS and she also had enhancement of the right optic nerve.    The next day, MRI of the spie showed a single focus just below the cervicomedullary junction.   She felt some improvement a day after the first steroid infusion and was seeing better upon discharge at 5 days.       In retrospect she has sometimes felt clumsy at times but never had a period > 24 hours of clumsiness, weakness or numbness.     Data Imaging: MRI of the brain and orbits 03/30/2021 shows scattered T2/FLAIR hyperintense foci in the periventricular and juxtacortical white matter.  There is enhancement of the prechiasmatic right optic nerve.  This is consistent with acute optic neuritis and chronic demyelination consistent with MS.  MRI of the cervical and thoracic spine 03/31/2021 shows a T2/flair hyperintense focus at the cervicomedullary junction to the right (maybe smaller one to left  medulla)consistent with demyelination.  Disc protrusion at C5-C6 that does not cause nerve root compression or spinal stenosis.  No abnormal enhancement.  MRI of the brain 12/27/2021 T2/FLAIR hyperintense foci in the periventricular and juxtacortical white matter of the cerebral hemispheres in a pattern consistent with chronic demyelinating plaque associated with multiple sclerosis. None of the foci appear to be acute. They do not enhance. Compared to the MRI from 03/30/2021, there are no new lesions.   Laboratory March 2023: ESR, CRP, neuromyelitis optica antibody, anti-MOG antibody, HIV, hepatitis C were negative or normal.  TSH was nornal  JCV 06/12/2022 high positive at 2.48.  REVIEW OF SYSTEMS: Constitutional: No fevers, chills, sweats, or change in  appetite Eyes: No visual changes, double vision, eye pain Ear, nose and throat: No hearing loss, ear pain, nasal congestion, sore throat Cardiovascular: No chest pain, palpitations Respiratory:  No shortness of breath at rest or with exertion.   No wheezes GastrointestinaI: No nausea, vomiting, diarrhea, abdominal pain, fecal incontinence Genitourinary:  No dysuria, urinary retention or frequency.  No nocturia. Musculoskeletal:  No neck pain, back pain Integumentary: No rash, pruritus, skin lesions Neurological: as above Psychiatric: No depression at this time.  No anxiety Endocrine: No palpitations, diaphoresis, change in appetite, change in weigh or increased thirst Hematologic/Lymphatic:  No anemia, purpura, petechiae. Allergic/Immunologic: No itchy/runny eyes, nasal congestion, recent allergic reactions, rashes  ALLERGIES: No Known Allergies  HOME MEDICATIONS:  Current Outpatient Medications:    amphetamine-dextroamphetamine (ADDERALL) 20 MG tablet, Take 20 mg by mouth daily., Disp: , Rfl:    amphetamine-dextroamphetamine (ADDERALL) 30 MG tablet, Take 30 mg by mouth 2 (two) times daily., Disp: , Rfl:    Cholecalciferol (VITAMIN D3) 50 MCG (2000 UT) CAPS, Take 2 capsules (4,000 Units total) by mouth daily., Disp: 180 capsule, Rfl: 1   loratadine (CLARITIN) 10 MG tablet, Take 10 mg by mouth daily., Disp: , Rfl:    Propylene Glycol (SYSTANE COMPLETE OP), Place 1 drop into both eyes daily as needed (dry eyes)., Disp: , Rfl:    desogestrel-ethinyl estradiol (AZURETTE) 0.15-0.02/0.01 MG (21/5) tablet, Take 1 tablet by mouth daily. (Patient not taking: Reported on 05/31/2022), Disp: 84 tablet, Rfl: 3   fluconazole (DIFLUCAN) 150 MG tablet, Take one pill by mouth every 3 days (Patient not taking: Reported on 06/13/2022), Disp: 3 tablet, Rfl: 0   natalizumab (TYSABRI) 300 MG/15ML injection, Inject 300 mg into the vein every 28 (twenty-eight) days. (Patient not taking: Reported on 06/13/2022),  Disp: , Rfl:    VRAYLAR 1.5 MG capsule, Take 1.5 mg by mouth daily. (Patient not taking: Reported on 05/31/2022), Disp: , Rfl:   PAST MEDICAL HISTORY: Past Medical History:  Diagnosis Date   History of ADHD    History of anxiety disorder    History of bipolar disorder     PAST SURGICAL HISTORY: Past Surgical History:  Procedure Laterality Date   NO PAST SURGERIES      FAMILY HISTORY: Family History  Problem Relation Age of Onset   ALS Father    Diabetes Maternal Grandmother     SOCIAL HISTORY:  Social History   Socioeconomic History   Marital status: Single    Spouse name: Not on file   Number of children: Not on file   Years of education: Not on file   Highest education level: Not on file  Occupational History   Not on file  Tobacco Use   Smoking status: Former    Types: Cigarettes   Smokeless tobacco: Never  Tobacco comments:    vapes  Vaping Use   Vaping Use: Every day  Substance and Sexual Activity   Alcohol use: Yes    Comment: occ   Drug use: Yes    Types: Marijuana   Sexual activity: Not on file  Other Topics Concern   Not on file  Social History Narrative   Not on file   Social Determinants of Health   Financial Resource Strain: Not on file  Food Insecurity: Not on file  Transportation Needs: Not on file  Physical Activity: Not on file  Stress: Not on file  Social Connections: Not on file  Intimate Partner Violence: Not on file     PHYSICAL EXAM  Vitals:   06/13/22 1422  BP: 120/81  Pulse: (!) 109  Weight: 175 lb (79.4 kg)  Height: 5\' 7"  (1.702 m)    Body mass index is 27.41 kg/m.  No results found.   General: The patient is well-developed and well-nourished and in no acute distress  HEENT:  Head is [place]/AT.  Sclera are anicteric.    Neck: Normal range of motion.    Skin: Extremities are without rash or  edema.  Musculoskeletal:  Back is nontender  Neurologic Exam  Mental status: The patient is alert and oriented x 3  at the time of the examination. The patient has apparent normal recent and remote memory, with an apparently normal attention span and concentration ability.   Speech is normal.  Cranial nerves: Extraocular movements are full.  She has a 2+ right APD.  Color vision is now symmetric.  Facial symmetry is present. There is good facial sensation to soft touch bilaterally.Facial strength is normal.  Trapezius and sternocleidomastoid strength is normal. No dysarthria is noted.   No obvious hearing deficits are noted.  Motor:  Muscle bulk is normal.   Tone is normal. Strength is  5 / 5 in all 4 extremities.   Sensory: Sensory testing is intact to pinprick, soft touch and vibration sensation in all 4 extremities.  Coordination: Cerebellar testing reveals good finger-nose-finger and heel-to-shin bilaterally.  Gait and station: Station is normal.   Gait is normal. Tandem gait is near normal. Romberg is negative.   Reflexes: Deep tendon reflexes are symmetric and normal bilaterally.       DIAGNOSTIC DATA (LABS, IMAGING, TESTING) - I reviewed patient records, labs, notes, testing and imaging myself where available.  Lab Results  Component Value Date   WBC 7.6 06/07/2022   HGB 12.6 06/07/2022   HCT 38.4 06/07/2022   MCV 89 06/07/2022   PLT 287 06/07/2022      Component Value Date/Time   NA 139 05/24/2022 0842   K 5.0 05/24/2022 0842   CL 101 05/24/2022 0842   CO2 20 05/24/2022 0842   GLUCOSE 89 05/24/2022 0842   GLUCOSE 86 03/31/2021 0333   BUN 23 (H) 05/24/2022 0842   CREATININE 0.83 05/24/2022 0842   CALCIUM 9.7 05/24/2022 0842   PROT 7.0 05/24/2022 0842   ALBUMIN 4.5 05/24/2022 0842   AST 18 05/24/2022 0842   ALT 15 05/24/2022 0842   ALKPHOS 95 05/24/2022 0842   BILITOT 0.4 05/24/2022 0842   GFRNONAA >60 03/31/2021 0333   Lab Results  Component Value Date   CHOL 185 05/24/2022   HDL 59 05/24/2022   LDLCALC 108 (H) 05/24/2022   LDLDIRECT 58.0 04/05/2021   TRIG 100 05/24/2022    CHOLHDL 3.1 05/24/2022    Lab Results  Component Value Date  TSH 1.690 05/24/2022       ASSESSMENT AND PLAN  Multiple sclerosis (HCC) - Plan: HIV Antibody (routine testing w rflx), Hep B Surface Antigen, Hepatitis B Core AB, Total, IgG, IgA, IgM  High risk medication use - Plan: HIV Antibody (routine testing w rflx), Hep B Surface Antigen, Hepatitis B Core AB, Total, IgG, IgA, IgM  Bipolar affective disorder, remission status unspecified (HCC)  Optic neuritis, right; multiple sclerosis  Her JCV antibody has unfortunately become high positive (2.48) after being negative for well.  Therefore, we need to use a different disease modifying therapy   Dur to her initial presentation and known spinal cord  and optic nerve lesions, I recommend an anti-CD20 agent (either Ocrevus, Briumvi) If bladder issues worsen will consider Oxybutynin or other med.  3.   If headaches worsen, consider trigger point injection or adding tricyclic or or zonisamide/Topamax. 4.   Return in 6 months or sooner if there are new or worsening neurologic symptoms.   Lundynn Cohoon A. Epimenio Foot, MD, [facility] 06/13/2022, 2:46 PM Certified in Neurology, Clinical Neurophysiology, Sleep Medicine and Neuroimaging  [facility] [address] [place], [place] [address] [phone]

## 2022-06-14 ENCOUNTER — Encounter: Payer: Self-pay | Admitting: *Deleted

## 2022-06-14 LAB — HEPATITIS B CORE ANTIBODY, TOTAL: Hep B Core Total Ab: NEGATIVE

## 2022-06-14 LAB — IGG, IGA, IGM
IgA/Immunoglobulin A, Serum: 204 mg/dL (ref 87–352)
IgG (Immunoglobin G), Serum: 971 mg/dL (ref 586–1602)
IgM (Immunoglobulin M), Srm: 41 mg/dL (ref 26–217)

## 2022-06-14 LAB — HIV ANTIBODY (ROUTINE TESTING W REFLEX): HIV Screen 4th Generation wRfx: NONREACTIVE

## 2022-06-14 LAB — HEPATITIS B SURFACE ANTIGEN: Hepatitis B Surface Ag: NEGATIVE

## 2022-06-15 ENCOUNTER — Telehealth: Payer: Self-pay | Admitting: *Deleted

## 2022-06-15 NOTE — Telephone Encounter (Signed)
Faxed complete/signed Ocrevus start form to [facility] at [phone]. Received fax confirmation.  Gave completed signed order for intrafusion.

## 2022-06-20 ENCOUNTER — Telehealth: Payer: Self-pay | Admitting: *Deleted

## 2022-06-20 ENCOUNTER — Encounter: Payer: Self-pay | Admitting: Family Medicine

## 2022-06-20 MED ORDER — VITAMIN D3 50 MCG (2000 UT) PO CAPS: 4000.0000 [IU] | ORAL_CAPSULE | Freq: Every day | ORAL | 1 refills | Status: DC

## 2022-06-20 MED ORDER — FLUCONAZOLE 150 MG PO TABS: ORAL_TABLET | ORAL | 0 refills | Status: DC

## 2022-06-20 NOTE — Telephone Encounter (Signed)
Sent. Follow up if no improvement.

## 2022-06-20 NOTE — Telephone Encounter (Signed)
Pt sent a message saying:   The meds you prescribed for the yeast infection I had at my last appointment worked, but I've felt it coming back since like Sunday/Monday, so I was wondering if you could please call in another prescription for me?   I've switched pharmacies to the [facility] at [facility] in [place], so if you could please send it there, that'd be awesome!   I was also hoping that you could go ahead and send in a refill for my Vitamin D pills as well - my current prescription is going to run out within the next week.

## 2022-06-21 NOTE — Telephone Encounter (Signed)
Sent mychart message letting pt know 

## 2022-06-28 ENCOUNTER — Encounter: Payer: Self-pay | Admitting: Neurology

## 2022-07-03 ENCOUNTER — Ambulatory Visit (INDEPENDENT_AMBULATORY_CARE_PROVIDER_SITE_OTHER): Payer: Managed Care, Other (non HMO) | Admitting: Family Medicine

## 2022-07-03 ENCOUNTER — Encounter: Payer: Self-pay | Admitting: Family Medicine

## 2022-07-03 VITALS — BP 102/60 | HR 106 | Temp 97.8°F | Ht 66.75 in | Wt 177.1 lb

## 2022-07-03 DIAGNOSIS — Z0000 Encounter for general adult medical examination without abnormal findings: Secondary | ICD-10-CM

## 2022-07-03 DIAGNOSIS — F900 Attention-deficit hyperactivity disorder, predominantly inattentive type: Secondary | ICD-10-CM

## 2022-07-03 DIAGNOSIS — Z23 Encounter for immunization: Secondary | ICD-10-CM

## 2022-07-03 DIAGNOSIS — G35 Multiple sclerosis: Secondary | ICD-10-CM

## 2022-07-03 DIAGNOSIS — F319 Bipolar disorder, unspecified: Secondary | ICD-10-CM | POA: Diagnosis not present

## 2022-07-03 DIAGNOSIS — Z720 Tobacco use: Secondary | ICD-10-CM

## 2022-07-03 MED ORDER — VITAMIN D3 50 MCG (2000 UT) PO CAPS: 4000.0000 [IU] | ORAL_CAPSULE | Freq: Every day | ORAL | 3 refills | Status: DC

## 2022-07-03 NOTE — Assessment & Plan Note (Signed)
Encouraged strongly to consider quitting  (unsure how much nicotine currently) Discussed some vaping effects on lungs long term She does not feel ready to quit

## 2022-07-03 NOTE — Assessment & Plan Note (Signed)
Reviewed health habits including diet and exercise and skin cancer prevention Reviewed appropriate screening tests for age  Also reviewed health mt list, fam hx and immunization status , as well as social and family history   See HPI Labs reviewed and ordered Td updated today  Pap utd 03/2011 with neg HPV and neg STD screen Declines need for STD screen today  Encouraged self breast exams  Continues vitamin D 4000 iu daily  Encouraged to quit vaping in tuture  (she is not ready now) Also encouraged to stop tanning/ tanning bed due to cancer risk  Encouraged to continue regular dermatology visits  PHQ is 4 (fairly good off medication and under care of psychiatry)

## 2022-07-03 NOTE — Assessment & Plan Note (Addendum)
Continues adderall from psychiatry  20 mg daily  30 mg bid   This may account for mildly increase HR  Encouraged to avoid caffeine  Tolerates well

## 2022-07-03 NOTE — Progress Notes (Signed)
Subjective:    Patient ID: Cindy Summers, female    DOB: 1985/12/25, 37 y.o.   MRN: [id number]  HPI  Here for health maintenance exam and to review chronic medical problems   Wt Readings from Last 3 Encounters:  07/03/22 177 lb 2 oz (80.3 kg)  06/13/22 175 lb (79.4 kg)  05/31/22 172 lb 8 oz (78.2 kg)   27.95 kg/m  Vitals:   07/03/22 1412  BP: 102/60  Pulse: (!) 106  Temp: 97.8 F (36.6 C)  SpO2: 100%    Immunization History  Administered Date(s) Administered   Influenza,inj,Quad PF,6+ Mos 12/02/2020   Influenza-Unspecified 01/25/2022   PFIZER(Purple Top)SARS-COV-2 Vaccination 05/12/2019, 06/03/2019, 03/03/2020   Td 07/03/2022    There are no preventive care reminders to display for this patient.  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    Smoking /vaping status  Uses nicotine - thinks 5 mg  More than 4 times daily     Derm care - goes to [place] [facility]  A year ago- removed pre cancerous mole on L thigh   Does go to the tanning bed  Does not want to stop  Helps her eczema      Mood Bipolar disorder  Was under care of psychiatrist  Also ADHD = takes adderall and this causes some tremor that is tolerable   No manic episodes recently - watching herself closely / is mindful     05/31/2022   11:14 AM 04/05/2021    2:33 PM  Depression screen PHQ 2/9  Decreased Interest 1 0  Down, Depressed, Hopeless 0 0  PHQ - 2 Score 1 0  Altered sleeping 1   Tired, decreased energy 1   Change in appetite 0   Feeling bad or failure about yourself  0   Trouble concentrating 1    Moving slowly or fidgety/restless 0   Suicidal thoughts 0   PHQ-9 Score 4   Difficult doing work/chores Not difficult at all    Sees neuro for MS  Dr Epimenio Foot  Had to change meds due to elevated JCV ab- discussed Ocrevus   Cholesterol Lab Results  Component Value Date   CHOL 185 05/24/2022   CHOL 148 04/05/2021   Lab Results  Component Value Date   HDL 59 05/24/2022   HDL 67.40 04/05/2021   Lab Results  Component Value Date   LDLCALC 108 (H) 05/24/2022   Lab Results  Component Value Date   TRIG 100 05/24/2022   TRIG 212.0 (H) 04/05/2021   Lab Results  Component Value Date   CHOLHDL 3.1 05/24/2022   CHOLHDL 2 04/05/2021   Lab Results  Component Value Date   LDLDIRECT 58.0 04/05/2021    Other labs CMP     Component Value Date/Time   NA 139 05/24/2022 0842   K 5.0 05/24/2022 0842   CL 101 05/24/2022 0842   CO2 20 05/24/2022 0842   GLUCOSE 89 05/24/2022 0842   GLUCOSE 86 03/31/2021 0333  BUN 23 (H) 05/24/2022 0842   CREATININE 0.83 05/24/2022 0842   CALCIUM 9.7 05/24/2022 0842   PROT 7.0 05/24/2022 0842   ALBUMIN 4.5 05/24/2022 0842   AST 18 05/24/2022 0842   ALT 15 05/24/2022 0842   ALKPHOS 95 05/24/2022 0842   BILITOT 0.4 05/24/2022 0842   EGFR 94 05/24/2022 0842   GFRNONAA >60 03/31/2021 0333   Lab Results  Component Value Date   WBC 7.6 06/07/2022   HGB 12.6 06/07/2022   HCT 38.4 06/07/2022   MCV 89 06/07/2022   PLT 287 06/07/2022   Lab Results  Component Value Date   TSH 1.690 05/24/2022    Patient Active Problem List   Diagnosis Date Noted   Multiple sclerosis (HCC) 05/31/2022   Ankle swelling 05/31/2022   Visit for routine gyn exam 04/05/2021   Optic neuritis, right; multiple sclerosis 03/30/2021   Allergic rhinitis 03/30/2021   Routine general medical examination at a health care facility 03/07/2021   Vapes nicotine containing substance 12/02/2020   Bipolar affective disorder (HCC) 08/11/2008   Attention deficit hyperactivity  disorder (ADHD) 08/11/2008   Past Medical History:  Diagnosis Date   History of ADHD    History of anxiety disorder    History of bipolar disorder    Past Surgical History:  Procedure Laterality Date   NO PAST SURGERIES     Social History   Tobacco Use   Smoking status: Former    Types: Cigarettes   Smokeless tobacco: Never   Tobacco comments:    vapes  Vaping Use   Vaping Use: Every day  Substance Use Topics   Alcohol use: Yes    Comment: occ   Drug use: Yes    Types: Marijuana   Family History  Problem Relation Age of Onset   ALS Father    Diabetes Maternal Grandmother    No Known Allergies Current Outpatient Medications on File Prior to Visit  Medication Sig Dispense Refill   amphetamine-dextroamphetamine (ADDERALL) 20 MG tablet Take 20 mg by mouth daily.     amphetamine-dextroamphetamine (ADDERALL) 30 MG tablet Take 30 mg by mouth 2 (two) times daily.     loratadine (CLARITIN) 10 MG tablet Take 10 mg by mouth daily.     natalizumab (TYSABRI) 300 MG/15ML injection Inject 300 mg into the vein every 28 (twenty-eight) days.     Propylene Glycol (SYSTANE COMPLETE OP) Place 1 drop into both eyes daily as needed (dry eyes).     VRAYLAR 1.5 MG capsule Take 1.5 mg by mouth daily. (Patient not taking: Reported on 07/03/2022)     No current facility-administered medications on file prior to visit.    Review of Systems  Constitutional:  Negative for activity change, appetite change, fatigue, fever and unexpected weight change.  HENT:  Negative for congestion, ear pain, rhinorrhea, sinus pressure and sore throat.   Eyes:  Negative for pain, redness and visual disturbance.  Respiratory:  Negative for cough, shortness of breath and wheezing.   Cardiovascular:  Negative for chest pain and palpitations.  Gastrointestinal:  Negative for abdominal pain, blood in stool, constipation and diarrhea.  Endocrine: Negative for polydipsia and polyuria.  Genitourinary:  Negative for  dysuria, frequency and urgency.  Musculoskeletal:  Negative for arthralgias, back pain and myalgias.  Skin:  Negative for pallor and rash.  Allergic/Immunologic: Negative for environmental allergies.  Neurological:  Negative for dizziness, syncope and headaches.  Hematological:  Negative for adenopathy. Does not bruise/bleed easily.  Psychiatric/Behavioral:  Positive for  decreased concentration. Negative for dysphoric mood. The patient is not nervous/anxious.        Objective:   Physical Exam Constitutional:      General: She is not in acute distress.    Appearance: Normal appearance. She is well-developed and normal weight. She is not ill-appearing or diaphoretic.  HENT:     Head: Normocephalic and atraumatic.     Right Ear: Tympanic membrane, ear canal and external ear normal.     Left Ear: Tympanic membrane, ear canal and external ear normal.     Nose: Nose normal. No congestion.     Mouth/Throat:     Mouth: Mucous membranes are moist.     Pharynx: Oropharynx is clear. No posterior oropharyngeal erythema.  Eyes:     General: No scleral icterus.    Extraocular Movements: Extraocular movements intact.     Conjunctiva/sclera: Conjunctivae normal.     Pupils: Pupils are equal, round, and reactive to light.  Neck:     Thyroid: No thyromegaly.     Vascular: No carotid bruit or JVD.  Cardiovascular:     Rate and Rhythm: Normal rate and regular rhythm.     Pulses: Normal pulses.     Heart sounds: Normal heart sounds.     No gallop.  Pulmonary:     Effort: Pulmonary effort is normal. No respiratory distress.     Breath sounds: Normal breath sounds. No wheezing.     Comments: Good air exch Chest:     Chest wall: No tenderness.  Abdominal:     General: Bowel sounds are normal. There is no distension or abdominal bruit.     Palpations: Abdomen is soft. There is no mass.     Tenderness: There is no abdominal tenderness.     Hernia: No hernia is present.  Musculoskeletal:         General: No tenderness. Normal range of motion.     Cervical back: Normal range of motion and neck supple. No rigidity. No muscular tenderness.     Right lower leg: No edema.     Left lower leg: No edema.     Comments: No kyphosis   Lymphadenopathy:     Cervical: No cervical adenopathy.  Skin:    General: Skin is warm and dry.     Coloration: Skin is not pale.     Findings: No erythema or rash.     Comments: Mildly tanned Solar lentigines diffusely  Some mild facial acne /not inflamed   Neurological:     Mental Status: She is alert. Mental status is at baseline.     Cranial Nerves: No cranial nerve deficit.     Motor: No abnormal muscle tone.     Coordination: Coordination normal.     Gait: Gait normal.     Deep Tendon Reflexes: Reflexes are normal and symmetric. Reflexes normal.  Psychiatric:        Attention and Perception: Attention normal.        Mood and Affect: Mood normal.        Cognition and Memory: Cognition and memory normal.     Comments: Mood is good today           Assessment & Plan:   Problem List Items Addressed This Visit       Nervous and Auditory   Multiple sclerosis [facility])    Seeing neurology Dr Epimenio Foot  Changing therapeutic to Ocrevus likely due to positive JCV antibody  No clinical changes  Other   Vapes nicotine containing substance    Encouraged strongly to consider quitting  (unsure how much nicotine currently) Discussed some vaping effects on lungs long term She does not feel ready to quit       Routine general medical examination at a health care facility - Primary    Reviewed health habits including diet and exercise and skin cancer prevention Reviewed appropriate screening tests for age  Also reviewed health mt list, fam hx and immunization status , as well as social and family history   See HPI Labs reviewed and ordered Td updated today  Pap utd 03/2011 with neg HPV and neg STD screen Declines need for STD screen today   Encouraged self breast exams  Continues vitamin D 4000 iu daily  Encouraged to quit vaping in tuture  (she is not ready now) Also encouraged to stop tanning/ tanning bed due to cancer risk  Encouraged to continue regular dermatology visits  PHQ is 4 (fairly good off medication and under care of psychiatry)       Bipolar affective disorder (HCC)    Pt did not tolerate her medications and is off both currently  Encouraged her to check in with psychiatry  She states she is good recognizing manic episodes and can manage better with experience Good mood today      Attention deficit hyperactivity disorder (ADHD)    Continues adderall from psychiatry  20 mg daily  30 mg bid   This may account for mildly increase HR  Encouraged to avoid caffeine  Tolerates well      Other Visit Diagnoses     Need for Td vaccine       Relevant Orders   Td : Tetanus/diphtheria >7yo Preservative  free (Completed)

## 2022-07-03 NOTE — Assessment & Plan Note (Signed)
Pt did not tolerate her medications and is off both currently  Encouraged her to check in with psychiatry  She states she is good recognizing manic episodes and can manage better with experience Good mood today

## 2022-07-03 NOTE — Patient Instructions (Addendum)
Think about getting away from vaping in the future-you can do it gradually  Use sun protection  I do recommend avoiding the tanning bed due to skin cancer risk  Labs look ok  Take care of yourself  Continue the exercise   Continue the vitamin D

## 2022-07-03 NOTE — Assessment & Plan Note (Signed)
Seeing neurology Dr Epimenio Foot  Changing therapeutic to Ocrevus likely due to positive JCV antibody  No clinical changes

## 2022-07-10 ENCOUNTER — Encounter: Payer: Self-pay | Admitting: Neurology

## 2022-08-01 ENCOUNTER — Ambulatory Visit: Payer: Managed Care, Other (non HMO) | Admitting: Adult Health

## 2022-09-26 NOTE — Telephone Encounter (Signed)
Patient's first Ocrevus infusion was 09/18/2022.

## 2022-10-01 ENCOUNTER — Other Ambulatory Visit: Payer: Self-pay | Admitting: Family Medicine

## 2022-12-19 ENCOUNTER — Encounter: Payer: Self-pay | Admitting: Neurology

## 2022-12-19 ENCOUNTER — Ambulatory Visit (INDEPENDENT_AMBULATORY_CARE_PROVIDER_SITE_OTHER): Payer: Managed Care, Other (non HMO) | Admitting: Neurology

## 2022-12-19 VITALS — BP 124/80 | HR 109 | Ht 67.5 in | Wt 202.5 lb

## 2022-12-19 DIAGNOSIS — F319 Bipolar disorder, unspecified: Secondary | ICD-10-CM

## 2022-12-19 DIAGNOSIS — R5383 Other fatigue: Secondary | ICD-10-CM

## 2022-12-19 DIAGNOSIS — G35 Multiple sclerosis: Secondary | ICD-10-CM

## 2022-12-19 DIAGNOSIS — E559 Vitamin D deficiency, unspecified: Secondary | ICD-10-CM

## 2022-12-19 DIAGNOSIS — H469 Unspecified optic neuritis: Secondary | ICD-10-CM

## 2022-12-19 DIAGNOSIS — Z79899 Other long term (current) drug therapy: Secondary | ICD-10-CM | POA: Diagnosis not present

## 2022-12-19 DIAGNOSIS — R635 Abnormal weight gain: Secondary | ICD-10-CM

## 2022-12-19 NOTE — Progress Notes (Signed)
[facility]  PATIENT: Cindy Summers DOB: [birth year]  REFERRING DOCTOR OR PCP:  Roxy Manns, MD SOURCE: Patient, notes from recent hospitalization, imaging and lab reports, MRI images personally reviewed.  _________________________________   HISTORICAL  CHIEF COMPLAINT:  Chief Complaint  Patient presents with   Follow-up    Pt in room 11, Jody mom in room. Here for MS follow up DMT: Ocrevus. Pt reports doing well,  no concerns.     HISTORY OF PRESENT ILLNESS:  Cindy Summers is a 37 y.o. woman with MS  Update 12/19/2022: She switched form Tysabri to Ocrevus in August 2024.  She was on Tysabri and tolerates it well.   Unfortunately, she converted to JCV antibody positive (was 2.48 when recently checked).    She tolerated the infusions well  Gait and balance are doing well.   She does not need to use the bannister on stairs.      She denies weakness.   She has mild finger tingling.   She has minimal urinary urgency not bad enough to treat.   Vision has returned to baseline right ON was presenting symptom).  Mood is stable,   She has bipolar disorder.   She used to be on lamotrigine and Vraylar. She felt she had diplopia on lamotrigine.   She gained weight on antidepressants.   She sees a Therapist, sports in [place].    She is also on Adderall.   She sleeps well most nights  though often wakes up once in middle of night.    Cognition is stable.        Headaches have done better   She has gained some weight over the last year.   She stopped the Vraylar but still has gained some wright.   She is not exercising now but did in past.      MS HISTORY: She had the onset of right visual changes 03/27/2021 at age 43.   She was watching TV and noted visual changes like a smudge and darkening of her glasses.   The next day, there seemed to be a dark glare out of the right eye.   She then noted that she could not see out of the right eye.   She went to [facility] 03/30/2021 and was  diagnosed with right otic neuritis and told to go to the ED for steroids and further evaluation.   On her examination she had reduced inferior visual field and central vision.   She could count fingers but not read out of that eye.   She received IV steroids and had imaging studies.   MRI of the brain showed foci c/w MS and she also had enhancement of the right optic nerve.    The next day, MRI of the spie showed a single focus just below the cervicomedullary junction.   She felt some improvement a day after the first steroid infusion and was seeing better upon discharge at 5 days.       In retrospect she has sometimes felt clumsy at times but never had a period > 24 hours of clumsiness, weakness or numbness.    She converted to JCV positive (2.4) and switched to Ocrevus with doses August/Septemeber   Data Imaging: MRI of the brain and orbits 03/30/2021 shows scattered T2/FLAIR hyperintense foci in the periventricular and juxtacortical white matter.  There is enhancement of the prechiasmatic right optic nerve.  This is consistent with acute optic neuritis and chronic demyelination consistent with MS.  MRI of the cervical and thoracic spine 03/31/2021 shows a T2/flair hyperintense focus at the cervicomedullary junction to the right (maybe smaller one to left medulla)consistent with demyelination.  Disc protrusion at C5-C6 that does not cause nerve root compression or spinal stenosis.  No abnormal enhancement.  MRI of the brain 12/27/2021 T2/FLAIR hyperintense foci in the periventricular and juxtacortical white matter of the cerebral hemispheres in a pattern consistent with chronic demyelinating plaque associated with multiple sclerosis. None of the foci appear to be acute. They do not enhance. Compared to the MRI from 03/30/2021, there are no new lesions.   Laboratory March 2023: ESR, CRP, neuromyelitis optica antibody, anti-MOG antibody, HIV, hepatitis C were negative or normal.  TSH was nornal  JCV 06/12/2022  high positive at 2.48.  REVIEW OF SYSTEMS: Constitutional: No fevers, chills, sweats, or change in appetite Eyes: No visual changes, double vision, eye pain Ear, nose and throat: No hearing loss, ear pain, nasal congestion, sore throat Cardiovascular: No chest pain, palpitations Respiratory:  No shortness of breath at rest or with exertion.   No wheezes GastrointestinaI: No nausea, vomiting, diarrhea, abdominal pain, fecal incontinence Genitourinary:  No dysuria, urinary retention or frequency.  No nocturia. Musculoskeletal:  No neck pain, back pain Integumentary: No rash, pruritus, skin lesions Neurological: as above Psychiatric: No depression at this time.  No anxiety Endocrine: No palpitations, diaphoresis, change in appetite, change in weigh or increased thirst Hematologic/Lymphatic:  No anemia, purpura, petechiae. Allergic/Immunologic: No itchy/runny eyes, nasal congestion, recent allergic reactions, rashes  ALLERGIES: No Known Allergies  HOME MEDICATIONS:  Current Outpatient Medications:    amphetamine-dextroamphetamine (ADDERALL) 20 MG tablet, Take 20 mg by mouth daily., Disp: , Rfl:    amphetamine-dextroamphetamine (ADDERALL) 30 MG tablet, Take 30 mg by mouth 2 (two) times daily., Disp: , Rfl:    Cholecalciferol (VITAMIN D3) 50 MCG (2000 UT) capsule, TAKE 2 CAPSULES (4,000 UNITS TOTAL) BY MOUTH DAILY., Disp: 180 capsule, Rfl: 1   loratadine (CLARITIN) 10 MG tablet, Take 10 mg by mouth daily., Disp: , Rfl:    Propylene Glycol (SYSTANE COMPLETE OP), Place 1 drop into both eyes daily as needed (dry eyes)., Disp: , Rfl:   PAST MEDICAL HISTORY: Past Medical History:  Diagnosis Date   History of ADHD    History of anxiety disorder    History of bipolar disorder     PAST SURGICAL HISTORY: Past Surgical History:  Procedure Laterality Date   NO PAST SURGERIES      FAMILY HISTORY: Family History  Problem Relation Age of Onset   ALS Father    Diabetes Maternal Grandmother      SOCIAL HISTORY:  Social History   Socioeconomic History   Marital status: Single    Spouse name: Not on file   Number of children: Not on file   Years of education: Not on file   Highest education level: Not on file  Occupational History   Not on file  Tobacco Use   Smoking status: Former    Types: Cigarettes   Smokeless tobacco: Never   Tobacco comments:    vapes  Vaping Use   Vaping status: Every Day  Substance and Sexual Activity   Alcohol use: Yes    Comment: occ   Drug use: Yes    Types: Marijuana   Sexual activity: Not on file  Other Topics Concern   Not on file  Social History Narrative   Not on file   Social Determinants of Health   Financial  Resource Strain: Not on file  Food Insecurity: Not on file  Transportation Needs: Not on file  Physical Activity: Not on file  Stress: Not on file  Social Connections: Not on file  Intimate Partner Violence: Not on file     PHYSICAL EXAM  Vitals:   12/19/22 1516  BP: 124/80  Pulse: (!) 109  Weight: 202 lb 8 oz (91.9 kg)  Height: 5' 7.5" (1.715 m)    Body mass index is 31.25 kg/m.  No results found.   General: The patient is well-developed and well-nourished and in no acute distress  HEENT:  Head is /AT.  Sclera are anicteric.    Neck: Normal range of motion.    Skin: Extremities are without rash or  edema.  Musculoskeletal:  Back is nontender  Neurologic Exam  Mental status: The patient is alert and oriented x 3 at the time of the examination. The patient has apparent normal recent and remote memory, with an apparently normal attention span and concentration ability.   Speech is normal.  Cranial nerves: Extraocular movements are full.    Color vision is now symmetric.  Facial symmetry is present. There is good facial sensation to soft touch bilaterally.Facial strength is normal.  Trapezius and sternocleidomastoid strength is normal. No dysarthria is noted.   No obvious hearing deficits are  noted.  Motor:  Muscle bulk is normal.   Tone is normal. Strength is  5 / 5 in all 4 extremities.   Sensory: Sensory testing is intact to pinprick, soft touch and vibration sensation in all 4 extremities.  Coordination: Cerebellar testing reveals good finger-nose-finger and heel-to-shin bilaterally.  Gait and station: Station is normal.   Gait is normal. Tandem gait is slightly wide.   Romberg is negative.   Reflexes: Deep tendon reflexes are symmetric and normal bilaterally.       DIAGNOSTIC DATA (LABS, IMAGING, TESTING) - I reviewed patient records, labs, notes, testing and imaging myself where available.  Lab Results  Component Value Date   WBC 7.6 06/07/2022   HGB 12.6 06/07/2022   HCT 38.4 06/07/2022   MCV 89 06/07/2022   PLT 287 06/07/2022      Component Value Date/Time   NA 139 05/24/2022 0842   K 5.0 05/24/2022 0842   CL 101 05/24/2022 0842   CO2 20 05/24/2022 0842   GLUCOSE 89 05/24/2022 0842   GLUCOSE 86 03/31/2021 0333   BUN 23 (H) 05/24/2022 0842   CREATININE 0.83 05/24/2022 0842   CALCIUM 9.7 05/24/2022 0842   PROT 7.0 05/24/2022 0842   ALBUMIN 4.5 05/24/2022 0842   AST 18 05/24/2022 0842   ALT 15 05/24/2022 0842   ALKPHOS 95 05/24/2022 0842   BILITOT 0.4 05/24/2022 0842   GFRNONAA >60 03/31/2021 0333   Lab Results  Component Value Date   CHOL 185 05/24/2022   HDL 59 05/24/2022   LDLCALC 108 (H) 05/24/2022   LDLDIRECT 58.0 04/05/2021   TRIG 100 05/24/2022   CHOLHDL 3.1 05/24/2022    Lab Results  Component Value Date   TSH 1.690 05/24/2022       ASSESSMENT AND PLAN  Multiple sclerosis (HCC) - Plan: CBC with Differential/Platelet, IgG, IgA, IgM, Comprehensive metabolic panel  High risk medication use - Plan: CBC with Differential/Platelet, IgG, IgA, IgM, Comprehensive metabolic panel  Bipolar affective disorder, remission status unspecified (HCC)  Optic neuritis, right; multiple sclerosis  Bipolar 1 disorder (HCC)  Vitamin D  deficiency  Other fatigue - Plan: Thyroid Panel With  TSH  Weight gain - Plan: Thyroid Panel With TSH   Continue Ocrevus.  Check labs Advised to call psychiatry to see if other treatment needed.    3.   Stay active and exercise as tolerated.    4.   Return in 6 months or sooner if there are new or worsening neurologic symptoms.   Rasheena Talmadge A. Epimenio Foot, MD, [facility] 12/19/2022, 3:40 PM Certified in Neurology, Clinical Neurophysiology, Sleep Medicine and Neuroimaging  [facility] [address], [address] [place], [place] [address] [phone]

## 2022-12-20 ENCOUNTER — Telehealth: Payer: Self-pay | Admitting: Neurology

## 2022-12-20 LAB — COMPREHENSIVE METABOLIC PANEL
ALT: 17 [IU]/L (ref 0–32)
AST: 18 [IU]/L (ref 0–40)
Albumin: 4.3 g/dL (ref 3.9–4.9)
Alkaline Phosphatase: 87 [IU]/L (ref 44–121)
BUN/Creatinine Ratio: 15 (ref 9–23)
BUN: 11 mg/dL (ref 6–20)
Bilirubin Total: <0.2 mg/dL (ref 0.0–1.2)
CO2: 23 mmol/L (ref 20–29)
Calcium: 9.5 mg/dL (ref 8.7–10.2)
Chloride: 102 mmol/L (ref 96–106)
Creatinine, Ser: 0.71 mg/dL (ref 0.57–1.00)
Globulin, Total: 2.3 g/dL (ref 1.5–4.5)
Glucose: 90 mg/dL (ref 70–99)
Potassium: 4.3 mmol/L (ref 3.5–5.2)
Sodium: 138 mmol/L (ref 134–144)
Total Protein: 6.6 g/dL (ref 6.0–8.5)
eGFR: 114 mL/min/{1.73_m2} (ref >59)

## 2022-12-20 LAB — IGG, IGA, IGM
IgA/Immunoglobulin A, Serum: 158 mg/dL (ref 87–352)
IgG (Immunoglobin G), Serum: 812 mg/dL (ref 586–1602)
IgM (Immunoglobulin M), Srm: 42 mg/dL (ref 26–217)

## 2022-12-20 LAB — CBC WITH DIFFERENTIAL/PLATELET
Basophils Absolute: 0 10*3/uL (ref 0.0–0.2)
Basos: 1 %
EOS (ABSOLUTE): 0.1 10*3/uL (ref 0.0–0.4)
Eos: 2 %
Hematocrit: 41.3 % (ref 34.0–46.6)
Hemoglobin: 13.7 g/dL (ref 11.1–15.9)
Immature Grans (Abs): 0 10*3/uL (ref 0.0–0.1)
Immature Granulocytes: 1 %
Lymphocytes Absolute: 1.8 10*3/uL (ref 0.7–3.1)
Lymphs: 30 %
MCH: 29.2 pg (ref 26.6–33.0)
MCHC: 33.2 g/dL (ref 31.5–35.7)
MCV: 88 fL (ref 79–97)
Monocytes Absolute: 0.7 10*3/uL (ref 0.1–0.9)
Monocytes: 12 %
Neutrophils Absolute: 3.3 10*3/uL (ref 1.4–7.0)
Neutrophils: 54 %
Platelets: 315 10*3/uL (ref 150–450)
RBC: 4.69 x10E6/uL (ref 3.77–5.28)
RDW: 12.3 % (ref 11.7–15.4)
WBC: 6.1 10*3/uL (ref 3.4–10.8)

## 2022-12-20 LAB — THYROID PANEL WITH TSH
Free Thyroxine Index: 2.3 (ref 1.2–4.9)
T3 Uptake Ratio: 28 % (ref 24–39)
T4, Total: 8.1 ug/dL (ref 4.5–12.0)
TSH: 1.68 u[IU]/mL (ref 0.450–4.500)

## 2022-12-20 NOTE — Telephone Encounter (Signed)
sent to [facility] they obtain [employer] auth [phone] 

## 2023-04-10 ENCOUNTER — Other Ambulatory Visit: Payer: Self-pay | Admitting: Family Medicine

## 2023-04-10 NOTE — Telephone Encounter (Signed)
 Please schedule annual exam June or later

## 2023-04-10 NOTE — Telephone Encounter (Signed)
 Last filled on 10/02/22 #180 caps/ 1 refill  CPE was on 07/03/22 no future appts

## 2023-04-11 NOTE — Telephone Encounter (Signed)
 Called patient and not able to lvm, sent mychart message.

## 2023-04-12 NOTE — Telephone Encounter (Signed)
 Contacted pt, wasn't able to leave vm

## 2023-04-13 NOTE — Telephone Encounter (Signed)
 Unable to leave vm.

## 2023-05-22 ENCOUNTER — Encounter: Payer: Self-pay | Admitting: Neurology

## 2023-05-24 ENCOUNTER — Ambulatory Visit
Admission: RE | Admit: 2023-05-24 | Discharge: 2023-05-24 | Disposition: A | Discharge status: Home / Self Care | Payer: Managed Care, Other (non HMO) | Source: Ambulatory Visit | Attending: Neurology | Admitting: Neurology

## 2023-05-24 ENCOUNTER — Encounter: Payer: Self-pay | Admitting: Neurology

## 2023-05-24 ENCOUNTER — Other Ambulatory Visit: Payer: Managed Care, Other (non HMO)

## 2023-05-24 DIAGNOSIS — G35 Multiple sclerosis: Secondary | ICD-10-CM | POA: Diagnosis not present

## 2023-05-24 MED ORDER — GADOPICLENOL 0.5 MMOL/ML IV SOLN
8.0000 mL | Freq: Once | INTRAVENOUS | Status: AC | PRN
  Administered 2023-05-24: 8 mL via INTRAVENOUS

## 2023-08-02 ENCOUNTER — Ambulatory Visit (INDEPENDENT_AMBULATORY_CARE_PROVIDER_SITE_OTHER): Payer: Managed Care, Other (non HMO) | Admitting: Neurology

## 2023-08-02 ENCOUNTER — Encounter: Payer: Self-pay | Admitting: Neurology

## 2023-08-02 VITALS — BP 122/81 | HR 100 | Ht 67.5 in | Wt 204.5 lb

## 2023-08-02 DIAGNOSIS — G35 Multiple sclerosis: Secondary | ICD-10-CM

## 2023-08-02 DIAGNOSIS — F319 Bipolar disorder, unspecified: Secondary | ICD-10-CM | POA: Diagnosis not present

## 2023-08-02 DIAGNOSIS — Z79899 Other long term (current) drug therapy: Secondary | ICD-10-CM | POA: Diagnosis not present

## 2023-08-02 DIAGNOSIS — R635 Abnormal weight gain: Secondary | ICD-10-CM

## 2023-08-02 DIAGNOSIS — H469 Unspecified optic neuritis: Secondary | ICD-10-CM | POA: Diagnosis not present

## 2023-08-02 DIAGNOSIS — E559 Vitamin D deficiency, unspecified: Secondary | ICD-10-CM

## 2023-08-02 NOTE — Progress Notes (Signed)
 [facility]  PATIENT: Cindy Summers DOB: November 01, 1985  REFERRING DOCTOR OR PCP:  Laine Balls, MD SOURCE: Patient, notes from recent hospitalization, imaging and lab reports, MRI images personally reviewed.  _________________________________   HISTORICAL  CHIEF COMPLAINT:  Chief Complaint  Patient presents with   Follow-up    Pt in room 10.alone. Here for MS follow up. DMT: Ocrevus, Last infusion date: 03/19/2023 Next infusion date: 09/17/2023. Patient reports doing well. Patient is concerned about her weight gain. Would like to know if safe to use with MS.      HISTORY OF PRESENT ILLNESS:  Cindy Summers is a 38 y.o. woman with MS  Update 08/02/2023: She switched form Tysabri to Ocrevus in August 2024.  She was on Tysabri and tolerates it well.   Unfortunately, she converted to JCV antibody positive (was 2.48 when recently checked).  Next infusion is Aug 2025.  She had an MRI of the brain in March 2025.  It was unchanged compared to 2023  Gait and balance are doing well.   She does not need to use the bannister on stairs.      She denies weakness.   Sometimes hands tingling - usually intermittent minutes to hours never > 1 day.  Sometimes its worse if she uses her phone.       She reports minimal urinary urgency not bad enough to treat. (1-2 times nightly)    Vision has returned to baseline right ON was presenting symptom).  Mood is stable,   She has bipolar disorder.   She used to be on lamotrigine and Vraylar. She felt she had diplopia on lamotrigine.   She gained weight on antidepressants.   She sees a Therapist, sports in [place].    She is also on Adderall.   She sleeps well most nights  though often wakes up once in middle of night.    Cognition is stable.        Headaches have done better   Naproxen usually helps  She has gained some weight over the last 1-2 years   She is thinking about getting on Wegovy or similar  She is not exercising now but did in past.       MS HISTORY: She had the onset of right visual changes 03/27/2021 at age 38.   She was watching TV and noted visual changes like a smudge and darkening of her glasses.   The next day, there seemed to be a dark glare out of the right eye.   She then noted that she could not see out of the right eye.   She went to [facility] 03/30/2021 and was diagnosed with right otic neuritis and told to go to the ED for steroids and further evaluation.   On her examination she had reduced inferior visual field and central vision.   She could count fingers but not read out of that eye.   She received IV steroids and had imaging studies.   MRI of the brain showed foci c/w MS and she also had enhancement of the right optic nerve.    The next day, MRI of the spie showed a single focus just below the cervicomedullary junction.   She felt some improvement a day after the first steroid infusion and was seeing better upon discharge at 36 days.       In retrospect she has sometimes felt clumsy at times but never had a period > 24 hours of clumsiness, weakness or numbness.  She converted to JCV positive (2.4) and switched to Ocrevus with doses August/Septemeber   Data Imaging: MRI of the brain and orbits 03/30/2021 shows scattered T2/FLAIR hyperintense foci in the periventricular and juxtacortical white matter.  There is enhancement of the prechiasmatic right optic nerve.  This is consistent with acute optic neuritis and chronic demyelination consistent with MS.  MRI of the cervical and thoracic spine 03/31/2021 shows a T2/flair hyperintense focus at the cervicomedullary junction to the right (maybe smaller one to left medulla)consistent with demyelination.  Disc protrusion at C5-C6 that does not cause nerve root compression or spinal stenosis.  No abnormal enhancement.  MRI of the brain 12/27/2021 T2/FLAIR hyperintense foci in the periventricular and juxtacortical white matter of the cerebral hemispheres in a pattern consistent with  chronic demyelinating plaque associated with multiple sclerosis. None of the foci appear to be acute. They do not enhance. Compared to the MRI from 03/30/2021, there are no new lesions.   MRI brain 5/1/20205 was unchanged.     Laboratory March 2023: ESR, CRP, neuromyelitis optica antibody, anti-MOG antibody, HIV, hepatitis C were negative or normal.  TSH was nornal  JCV 06/12/2022 high positive at 2.48.  REVIEW OF SYSTEMS: Constitutional: No fevers, chills, sweats, or change in appetite Eyes: No visual changes, double vision, eye pain Ear, nose and throat: No hearing loss, ear pain, nasal congestion, sore throat Cardiovascular: No chest pain, palpitations Respiratory:  No shortness of breath at rest or with exertion.   No wheezes GastrointestinaI: No nausea, vomiting, diarrhea, abdominal pain, fecal incontinence Genitourinary:  No dysuria, urinary retention or frequency.  No nocturia. Musculoskeletal:  No neck pain, back pain Integumentary: No rash, pruritus, skin lesions Neurological: as above Psychiatric: No depression at this time.  No anxiety Endocrine: No palpitations, diaphoresis, change in appetite, change in weigh or increased thirst Hematologic/Lymphatic:  No anemia, purpura, petechiae. Allergic/Immunologic: No itchy/runny eyes, nasal congestion, recent allergic reactions, rashes  ALLERGIES: No Known Allergies  HOME MEDICATIONS:  Current Outpatient Medications:    amphetamine -dextroamphetamine  (ADDERALL) 20 MG tablet, Take 20 mg by mouth daily., Disp: , Rfl:    amphetamine -dextroamphetamine  (ADDERALL) 30 MG tablet, Take 30 mg by mouth 2 (two) times daily., Disp: , Rfl:    Cholecalciferol  (VITAMIN D3) 50 MCG (2000 UT) capsule, TAKE 2 CAPSULES (4,000 UNITS TOTAL) BY MOUTH DAILY., Disp: 180 capsule, Rfl: 2   loratadine  (CLARITIN ) 10 MG tablet, Take 10 mg by mouth daily., Disp: , Rfl:    Propylene Glycol (SYSTANE COMPLETE OP), Place 1 drop into both eyes daily as needed (dry  eyes)., Disp: , Rfl:   PAST MEDICAL HISTORY: Past Medical History:  Diagnosis Date   History of ADHD    History of anxiety disorder    History of bipolar disorder     PAST SURGICAL HISTORY: Past Surgical History:  Procedure Laterality Date   NO PAST SURGERIES      FAMILY HISTORY: Family History  Problem Relation Age of Onset   ALS Father    Diabetes Maternal Grandmother     SOCIAL HISTORY:  Social History   Socioeconomic History   Marital status: Single    Spouse name: Not on file   Number of children: Not on file   Years of education: Not on file   Highest education level: Not on file  Occupational History   Not on file  Tobacco Use   Smoking status: Former    Types: Cigarettes   Smokeless tobacco: Never   Tobacco comments:  vapes  Vaping Use   Vaping status: Every Day  Substance and Sexual Activity   Alcohol use: Yes    Comment: occ   Drug use: Yes    Types: Marijuana   Sexual activity: Not on file  Other Topics Concern   Not on file  Social History Narrative   Not on file   Social Drivers of Health   Financial Resource Strain: Not on file  Food Insecurity: Not on file  Transportation Needs: Not on file  Physical Activity: Not on file  Stress: Not on file  Social Connections: Not on file  Intimate Partner Violence: Not on file     PHYSICAL EXAM  Vitals:   08/02/23 1440  BP: 122/81  Pulse: 100  Weight: 204 lb 8 oz (92.8 kg)  Height: 5' 7.5 (1.715 m)     Body mass index is 31.56 kg/m.  No results found.   General: The patient is well-developed and well-nourished and in no acute distress  HEENT:  Head is [place]/AT.  Sclera are anicteric.    Neck: Normal range of motion.    Skin: Extremities are without rash or  edema.  Musculoskeletal:  Back is nontender  Neurologic Exam  Mental status: The patient is alert and oriented x 3 at the time of the examination. The patient has apparent normal recent and remote memory, with an  apparently normal attention span and concentration ability.   Speech is normal.  Cranial nerves: Extraocular movements are full.    Color vision is now symmetric.  Facial symmetry is present. There is good facial sensation to soft touch bilaterally.Facial strength is normal.  Trapezius and sternocleidomastoid strength is normal. No dysarthria is noted.   No obvious hearing deficits are noted.  Motor:  Muscle bulk is normal.   Tone is normal. Strength is  5 / 5 in all 4 extremities.   Sensory: Sensory testing is intact to pinprick, soft touch and vibration sensation in all 4 extremities.  Coordination: Cerebellar testing reveals good finger-nose-finger and heel-to-shin bilaterally.  Gait and station: Station is normal.   Gait is normal. Tandem gait is slightly wide.   Romberg is negative.   Reflexes: Deep tendon reflexes are symmetric and normal bilaterally.       DIAGNOSTIC DATA (LABS, IMAGING, TESTING) - I reviewed patient records, labs, notes, testing and imaging myself where available.  Lab Results  Component Value Date   WBC 6.1 12/19/2022   HGB 13.7 12/19/2022   HCT 41.3 12/19/2022   MCV 88 12/19/2022   PLT 315 12/19/2022      Component Value Date/Time   NA 138 12/19/2022 1549   K 4.3 12/19/2022 1549   CL 102 12/19/2022 1549   CO2 23 12/19/2022 1549   GLUCOSE 90 12/19/2022 1549   GLUCOSE 86 03/31/2021 0333   BUN 11 12/19/2022 1549   CREATININE 0.71 12/19/2022 1549   CALCIUM  9.5 12/19/2022 1549   PROT 6.6 12/19/2022 1549   ALBUMIN 4.3 12/19/2022 1549   AST 18 12/19/2022 1549   ALT 17 12/19/2022 1549   ALKPHOS 87 12/19/2022 1549   BILITOT <0.2 12/19/2022 1549   GFRNONAA >60 03/31/2021 0333   Lab Results  Component Value Date   CHOL 185 05/24/2022   HDL 59 05/24/2022   LDLCALC 108 (H) 05/24/2022   LDLDIRECT 58.0 04/05/2021   TRIG 100 05/24/2022   CHOLHDL 3.1 05/24/2022    Lab Results  Component Value Date   TSH 1.680 12/19/2022  ASSESSMENT AND  PLAN  Multiple sclerosis (HCC) - Plan: IgG, IgA, IgM, CBC with Differential/Platelets  High risk medication use - Plan: IgG, IgA, IgM, CBC with Differential/Platelets  Optic neuritis, right; multiple sclerosis  Bipolar 1 disorder (HCC)  Vitamin D deficiency  Weight gain   She continues to do well on Ocrevus and will stay on the medication..  Labs were fine 12/19/2022 and we will recheck the IgG/IgM and CBC with differential. Continue to f/u with psychiatry.    3.   Stay active and exercise as tolerated.    4.   Return in 6 months or sooner if there are new or worsening neurologic symptoms.   Carlton Sweaney A. Vear, MD, [facility] 08/02/2023, 3:09 PM Certified in Neurology, Clinical Neurophysiology, Sleep Medicine and Neuroimaging  [facility] [address], [address] [place], [place] [address] [phone]

## 2023-08-03 ENCOUNTER — Ambulatory Visit: Payer: Self-pay | Admitting: Neurology

## 2023-08-03 LAB — IGG, IGA, IGM
IgA/Immunoglobulin A, Serum: 154 mg/dL (ref 87–352)
IgG (Immunoglobin G), Serum: 778 mg/dL (ref 586–1602)
IgM (Immunoglobulin M), Srm: 28 mg/dL (ref 26–217)

## 2023-08-03 LAB — CBC WITH DIFFERENTIAL/PLATELET
Basophils Absolute: 0 x10E3/uL (ref 0.0–0.2)
Basos: 0 %
EOS (ABSOLUTE): 0.1 x10E3/uL (ref 0.0–0.4)
Eos: 1 %
Hematocrit: 44.9 % (ref 34.0–46.6)
Hemoglobin: 14.8 g/dL (ref 11.1–15.9)
Immature Grans (Abs): 0.1 x10E3/uL (ref 0.0–0.1)
Immature Granulocytes: 1 %
Lymphocytes Absolute: 2.5 x10E3/uL (ref 0.7–3.1)
Lymphs: 23 %
MCH: 29.1 pg (ref 26.6–33.0)
MCHC: 33 g/dL (ref 31.5–35.7)
MCV: 88 fL (ref 79–97)
Monocytes Absolute: 0.9 x10E3/uL (ref 0.1–0.9)
Monocytes: 8 %
Neutrophils Absolute: 7.5 x10E3/uL — ABNORMAL HIGH (ref 1.4–7.0)
Neutrophils: 67 %
Platelets: 362 x10E3/uL (ref 150–450)
RBC: 5.08 x10E6/uL (ref 3.77–5.28)
RDW: 12.2 % (ref 11.7–15.4)
WBC: 11.2 x10E3/uL — ABNORMAL HIGH (ref 3.4–10.8)

## 2023-08-08 ENCOUNTER — Encounter (INDEPENDENT_AMBULATORY_CARE_PROVIDER_SITE_OTHER): Payer: Self-pay

## 2023-08-08 ENCOUNTER — Ambulatory Visit: Admitting: Family Medicine

## 2023-08-08 ENCOUNTER — Encounter: Payer: Self-pay | Admitting: Family Medicine

## 2023-08-08 VITALS — BP 122/68 | HR 112 | Temp 98.3°F | Ht 67.5 in | Wt 204.2 lb

## 2023-08-08 DIAGNOSIS — L989 Disorder of the skin and subcutaneous tissue, unspecified: Secondary | ICD-10-CM

## 2023-08-08 DIAGNOSIS — E669 Obesity, unspecified: Secondary | ICD-10-CM | POA: Diagnosis not present

## 2023-08-08 NOTE — Progress Notes (Signed)
 Subjective:    Patient ID: Cindy Summers, female    DOB: 12/18/1985, 38 y.o.   MRN: [id number]  HPI  Wt Readings from Last 3 Encounters:  08/08/23 204 lb 4 oz (92.6 kg)  08/02/23 204 lb 8 oz (92.8 kg)  12/19/22 202 lb 8 oz (91.9 kg)   31.52 kg/m  Vitals:   08/08/23 1507  BP: 122/68  Pulse: (!) 112  Temp: 98.3 F (36.8 C)  SpO2: 99%   Pt presents for c/o  Obesity  Check spots on face (cannot get into derm until nov)    Gained weight since pandemic started  Gained 20 lb being at home and sedentary to start -then hospitalized for MS  Then gained more from steroids  Gaining since then  Is difficult     Was going to the gym Then got blisters on foot  Then got a walking pad- used it for working   Exercise - slow walking while she works  Usually 3.2 pace   No strength training right now   Did Counsellor program for a while/ access to it - barre program / pilates  Used some 1 or 2 lb weights   Appetite is up and down  Some days cannot get enough to eat Some days does not feel hungry all day   Is a boredom eater  Otherwise not emotional eater in general   Food choices are not the most healthy  Time is a constraint  Tried the factor program for a while as well as hello fresh  Could not afford to continue    Grew up on not great foods   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)     ADHD- adderall from psychiatry  Bipolar dz- has been fine without medicine   MS -sees dr Vear  Had visit last week and did discuss use of glp-1 and thought it would be safe  Infusion therapy currently ocrevus every 6 months   Vaping status -still vaping nicotine   Etoh - a drink a day or less Otherwise only drinks water    No results found for: HGBA1C Lab Results  Component Value Date   ALT 17 12/19/2022   AST 18 12/19/2022   ALKPHOS 87 12/19/2022   BILITOT <0.2 12/19/2022    No history of sleep apnea    Skin issues  Skin spots -growths  on face Forehead  One on chin  Dermatology freezes them   Patient Active Problem List   Diagnosis Date Noted   Obesity (BMI 30-39.9) 08/08/2023   Multiple sclerosis (HCC) 05/31/2022   Ankle swelling 05/31/2022   Visit for routine gyn exam 04/05/2021   Optic neuritis, right; multiple sclerosis 03/30/2021   Allergic rhinitis 03/30/2021   Routine general medical examination at a health care facility 03/07/2021   Vapes nicotine containing substance 12/02/2020   Lesion of skin of face 12/02/2020   Bipolar affective disorder (HCC) 08/11/2008   Attention deficit hyperactivity disorder (ADHD) 08/11/2008   Past Medical History:  Diagnosis Date   History of ADHD    History of anxiety disorder    History of bipolar disorder    Past Surgical History:  Procedure Laterality Date   NO PAST SURGERIES     Social History   Tobacco Use   Smoking status: Former    Types: Cigarettes   Smokeless tobacco: Never   Tobacco comments:    vapes  Vaping Use   Vaping status: Every Day  Substance Use Topics   Alcohol use: Yes    Comment: occ   Drug use: Yes    Types: Marijuana   Family History  Problem Relation Age of Onset   ALS Father    Diabetes Maternal Grandmother    Allergies  Allergen Reactions   Pepto-Bismol [Bismuth Subsalicylate] Other (See Comments)    Tongue turns Delores   Current Outpatient Medications on File Prior to Visit  Medication Sig Dispense Refill   amphetamine -dextroamphetamine  (ADDERALL) 20 MG tablet Take 20 mg by mouth daily.     amphetamine -dextroamphetamine  (ADDERALL) 30 MG tablet Take 30 mg by mouth 2 (two) times daily.     Cholecalciferol  (VITAMIN D3) 50 MCG (2000 UT) capsule TAKE 2 CAPSULES (4,000 UNITS TOTAL) BY MOUTH DAILY. 180 capsule 2   loratadine  (CLARITIN ) 10 MG tablet Take 10 mg by mouth daily.     Propylene Glycol (SYSTANE COMPLETE OP) Place 1 drop into both eyes daily as needed (dry eyes).     No current facility-administered medications on file  prior to visit.    Review of Systems  Constitutional:  Positive for appetite change, fatigue and unexpected weight change.  Eyes:  Negative for visual disturbance.       No recent change   Psychiatric/Behavioral:         Mood is stable       Objective:   Physical Exam Constitutional:      General: She is not in acute distress.    Appearance: Normal appearance. She is well-developed. She is obese. She is not ill-appearing or diaphoretic.  HENT:     Head: Normocephalic and atraumatic.  Eyes:     Conjunctiva/sclera: Conjunctivae normal.     Pupils: Pupils are equal, round, and reactive to light.  Neck:     Thyroid : No thyromegaly.     Vascular: No carotid bruit or JVD.  Cardiovascular:     Rate and Rhythm: Normal rate and regular rhythm.     Heart sounds: Normal heart sounds.     No gallop.  Pulmonary:     Effort: Pulmonary effort is normal. No respiratory distress.     Breath sounds: Normal breath sounds. No wheezing or rales.  Abdominal:     General: There is no distension or abdominal bruit.     Palpations: Abdomen is soft.  Musculoskeletal:     Cervical back: Normal range of motion and neck supple.     Right lower leg: No edema.     Left lower leg: No edema.  Lymphadenopathy:     Cervical: No cervical adenopathy.  Skin:    General: Skin is warm and dry.     Coloration: Skin is not pale.     Findings: No rash.     Comments: Warty growth- left chin and mid forehead/ less than 5 mm Tanned Solar lentigines diffusely   Neurological:     Mental Status: She is alert.     Coordination: Coordination normal.     Deep Tendon Reflexes: Reflexes are normal and symmetric. Reflexes normal.  Psychiatric:        Mood and Affect: Mood normal.           Assessment & Plan:   Problem List Items Addressed This Visit       Musculoskeletal and Integument   Lesion of skin of face   Several verrucous lesions - chin and forehead  Recommend follow up with derm as planned  for treatment/ removal   Encouraged not to  touch face/keep clean        Other   Obesity (BMI 30-39.9) - Primary   Pt has not tried optimal diet/exercise yet  Discussed how this problem influences overall health and the risks it imposes  Reviewed plan for weight loss with lower calorie diet (via better food choices (lower glycemic and portion control) along with exercise building up to or more than 30 minutes 5 days per week including some aerobic activity and strength training   Needs to get away from junk/convenience eating   Referral made to healthy weight and wellness center Is interested in learning about nutrition   May consider glp-1 med in future if not successful with lifestyle change  Unsure about coverage       Relevant Orders   Amb Ref to Medical Weight Management

## 2023-08-08 NOTE — Assessment & Plan Note (Addendum)
 Pt has not tried optimal diet/exercise yet  Discussed how this problem influences overall health and the risks it imposes  Reviewed plan for weight loss with lower calorie diet (via better food choices (lower glycemic and portion control) along with exercise building up to or more than 30 minutes 5 days per week including some aerobic activity and strength training   Needs to get away from junk/convenience eating   Referral made to healthy weight and wellness center Is interested in learning about nutrition   May consider glp-1 med in future if not successful with lifestyle change  Unsure about coverage   29 minutes were spent today both face to face and in the chart obtaining history(neuro and primary) reviewing records and test results (neuro)   performing exam , educating and discussing treatment options, and placing orders (referral)

## 2023-08-08 NOTE — Patient Instructions (Addendum)
 Add some strength training to your routine, this is important for bone and brain health and can reduce your risk of falls and help your body use insulin properly and regulate weight  Light weights, exercise bands , and internet videos are a good way to start  Yoga (chair or regular), machines , floor exercises or a gym with machines are also good options  The beach body programs are great Aim for 5 or more days per week 30 or more minutes   For diet  Avoid added sugar  Try to get most of your carbohydrates from produce (with the exception of white potatoes) and whole grains Eat less bread/pasta/rice/snack foods/cereals/sweets and other items from the middle of the grocery store (processed carbs) Minimize alcohol  Keep drinking water   Eat lots of lean protein    I put the referral in for the healthy weight and wellness clinic  Please let us  know if you don't hear in 1-2 weeks to set that up   You can look up the healthy weight and wellness center through [facility] and give them a call as well

## 2023-08-08 NOTE — Assessment & Plan Note (Signed)
 Several verrucous lesions - chin and forehead  Recommend follow up with derm as planned for treatment/ removal   Encouraged not to touch face/keep clean

## 2023-09-11 ENCOUNTER — Ambulatory Visit (INDEPENDENT_AMBULATORY_CARE_PROVIDER_SITE_OTHER): Admitting: Family Medicine

## 2023-09-11 ENCOUNTER — Encounter (INDEPENDENT_AMBULATORY_CARE_PROVIDER_SITE_OTHER): Payer: Self-pay | Admitting: Family Medicine

## 2023-09-11 VITALS — BP 114/79 | HR 124 | Temp 98.3°F | Ht 66.0 in | Wt 202.0 lb

## 2023-09-11 DIAGNOSIS — E669 Obesity, unspecified: Secondary | ICD-10-CM

## 2023-09-11 DIAGNOSIS — G35 Multiple sclerosis: Secondary | ICD-10-CM

## 2023-09-11 DIAGNOSIS — Z6832 Body mass index (BMI) 32.0-32.9, adult: Secondary | ICD-10-CM

## 2023-09-11 DIAGNOSIS — F900 Attention-deficit hyperactivity disorder, predominantly inattentive type: Secondary | ICD-10-CM

## 2023-09-11 DIAGNOSIS — F319 Bipolar disorder, unspecified: Secondary | ICD-10-CM

## 2023-09-11 NOTE — Progress Notes (Signed)
 Barnie DOROTHA Jenkins, DO, ABFM, ABOM Bariatric physician [address] [place], [place], [place] [address] Office: [phone]  /  Fax: [phone]    Initial Evaluation:  Cindy Summers was seen in clinic today to evaluate for obesity. She is interested in losing weight to improve overall health and reduce the risk of weight related complications. She presents today to review program treatment options, initial physical assessment, and evaluation.     She was referred by: PCP  When asked what they hope to accomplish? She states: improve existing medical conditions, improve quality of life, improve appearance.  When asked how has your weight affected you? She states: Contributed to medical problems and Has affected mood   Contributing factors to her weight change: nutritional, medications, reduced physical activity, problems with eating patterns.  Some associated conditions: Hyperlipidemia and GERD  Current nutrition plan: None  Current level of physical activity: None  Previous pharmacotherapy: OTC fat burners + supplements   Response to medication: Lost weight initially but was unable to sustain weight loss   Past Medical History:  Diagnosis Date   History of ADHD    History of anxiety disorder    History of bipolar disorder     Current Outpatient Medications  Medication Instructions   amphetamine -dextroamphetamine  (ADDERALL) 20 MG tablet 20 mg, Daily   amphetamine -dextroamphetamine  (ADDERALL) 30 MG tablet 30 mg, 2 times daily   loratadine  (CLARITIN ) 10 mg, Daily   Propylene Glycol (SYSTANE COMPLETE OP) 1 drop, Daily PRN   Vitamin D3 4,000 Units, Oral, Daily     Allergies  Allergen Reactions   Pepto-Bismol [Bismuth Subsalicylate] Other (See Comments)    Tongue turns Cindy Summers     Past Surgical History:  Procedure Laterality Date   NO PAST SURGERIES       Family History  Problem Relation Age of Onset   ALS Father    Diabetes Maternal Grandmother      Objective:  BP  114/79   Pulse (!) 124   Temp 98.3 F (36.8 C)   Ht 5' 6 (1.676 m)   Wt 202 lb (91.6 kg)   LMP 09/05/2023   SpO2 95%   BMI 32.60 kg/m  She was weighed on the bioimpedance scale: Body mass index is 32.6 kg/m.  Visceral Fat rating : 8, Body Fat %: 39  Weight Lost Since Last Visit: NA  Weight Gained Since Last Visit: NA  Vitals Temp: 98.3 F (36.8 C) BP: 114/79 Pulse Rate: (!) 124 SpO2: 95 %   Anthropometric Measurements Height: 5' 6 (1.676 m) Weight: 202 lb (91.6 kg) BMI (Calculated): 32.62 Weight at Last Visit: NA Weight Lost Since Last Visit: NA Weight Gained Since Last Visit: NA Starting Weight: NA Total Weight Loss (lbs): -- (NA) Peak Weight: 204lb Waist Measurement : -- (NA)   Body Composition  Body Fat %: 39 % Fat Mass (lbs): 79 lbs Muscle Mass (lbs): 117.4 lbs Total Body Water (lbs): 77.6 lbs Visceral Fat Rating : 8   Other Clinical Data A1c: -- (NA) RMR: -- (NA) Fasting: No Labs: No Today's Visit #: Consult Starting Date: -- (NA) Comments: Consult     General: Well Developed, well nourished, and in no acute distress.  HEENT: Normocephalic, atraumatic; EOMI, sclerae are anicteric. Skin: Warm and dry, good turgor Chest:  Normal excursion, shape, no gross ABN Respiratory: No conversational dyspnea; speaking in full sentences NeuroM-Sk:  Normal gross ROM * 4 extremities  Psych: A and O *3, insight adequate, mood- full  Assessment and Plan:    FOR THE DISEASE OF OBESITY:   Obesity (BMI 30-39.9) Assessment & Plan: Hx of obesity per Dr.Tower OV note dated 08/08/2023:    Gained weight since pandemic started  Gained 20 lb being at home and sedentary to start -then hospitalized for MS  Then gained more from steroids  Gaining since then  Is difficult    Was going to the gym Then got blisters on foot  Then got a walking pad- used it for working    Exercise - slow walking while she works  Usually 3.2 pace    No strength training  right now    Did Counsellor program for a while/ access to it - barre program / pilates  Used some 1 or 2 lb weights    Appetite is up and down  Some days cannot get enough to eat Some days does not feel hungry all day    Is a boredom eater  Otherwise not emotional eater in general    Food choices are not the most healthy  Time is a constraint  Tried the factor program for a while as well as hello fresh  Could not afford to continue    Grew up on not great foods    We reviewed anthropometrics, biometrics, associated medical conditions and contributing factors with patient. Cindy Summers would benefit from a medically tailored reduced calorie nutrional plan based on their REE (resting energy expenditure), which will be determined by indirect calorimetry.  We will also assess for cardiometabolic risk and nutritional derangements via fasting labs at intake appointment.    Obesity Treatment / Action Plan:   she was weighed on the bioimpedance scale and results were discussed and documented in the synopsis.   Cindy Summers will complete provided nutritional and psychosocial assessment questionnaire before the next appointment.  she will be scheduled for indirect calorimetry to determine resting energy expenditure in a fasting state.  This will allow us  to create a reduced calorie, high-protein meal plan to promote loss of fat mass while preserving muscle mass.  We will also assess for cardiometabolic risk and nutritional derangements via an ECG and fasting serologies at her next appointment.  she was encouraged to work on amassing support from family and friends to begin their weight loss journey.   Work on eliminating or reducing the presence of highly processed, poorly nutritious, calorie-dense foods in the home.   Obesity Education Performed Today:  Patient was counseled on nutritional approaches to weight loss and benefits of reducing processed foods and consuming plant-based foods and  high quality protein as part of nutritional weight management program.   We discussed the importance of long term lifestyle changes which include nutrition, exercise and behavioral modifications as well as the importance of customizing this to her specific health and social needs.   We discussed the benefits of reaching a healthier weight to alleviate the symptoms of existing conditions and reduce the risks of the biomechanical, metabolic and psychological effects of obesity.  Was counseled on the health benefits of losing 5%-10% of total body weight.  Was counseled on our cognitive behavorial therapy program, lead by our bariatric psychologist, who focuses on emotional eating and creating positive behavorial change.  Was counseled on bariatric pharmacotherapy and how this may be used as an adjunct in their weight management    Cindy Summers appears to be in the action stage of change and states they are ready to start intensive lifestyle modifications and behavioral  modifications.  It was recommended that she follow up in the next 1-2 weeks to review the above steps, and to continue with treatment of their chronic disease state of obesity   FOR OTHER CONDITIONS RELATED TO THE DISEASE OF OBESITY:   Multiple sclerosis (HCC) Assessment & Plan: MS HISTORY:  She had the onset of right visual changes 03/27/2021 at age 59.   She was watching TV and noted visual changes like a smudge and darkening of her glasses.   The next day, there seemed to be a dark glare out of the right eye.   She then noted that she could not see out of the right eye.   [facility] to [facility] and was diagnosed with right otic neuritis and told to go to the ED for steroids and further evaluation.   On her examination she had reduced inferior visual field and central vision.   She could count fingers but not read out of that eye.   She received IV steroids and had imaging studies.   MRI of the brain showed foci c/w MS and  she also had enhancement of the right optic nerve.    The next day, MRI of the spie showed a single focus just below the cervicomedullary junction.   She felt some improvement a day after the first steroid infusion and was seeing better upon discharge at 5 days.  In retrospect she has sometimes felt clumsy at times but never had a period > 24 hours of clumsiness, weakness or numbness.    She converted to JCV positive (2.4) and switched from Tysabri to Ocrevus in August 2024.  She feels her MS is currently well-controlled and she has no functional disabilities. She will continue all care per Dr.Sater of neurology.    Bipolar affective disorder, remission status unspecified (HCC) Assessment & Plan: She has bipolar disorder. Moods are stable. She used to be on lamotrigine and Vraylar. She gained weight on antidepressants. She sees a Therapist, sports in [place]. Pt states her psychiatrist does not feel she needs medicines at this time and pt also feels she has been doing well w/o medicine. She does not have a Veterinary surgeon. Overall, she feels her bipolar disorder is well controlled and does not feel it will affect her wt loss journey. She will continue all care per psychiatry.     Attention deficit hyperactivity disorder (ADHD), predominantly inattentive type Assessment & Plan: She has a history of ADHD. She is established with psychiatry and has been on Adderall since she was 38 y.o. She has no acute concerns regarding this condition and will continue all care per psych.    Attestations:   I, Special Puri, acting as a Stage manager for [employer], DO., have compiled all relevant documentation for today's office visit on behalf of Barnie Jenkins, DO, while in the presence of [employer], DO.  I have spent 55 minutes in the care of the patient today including 38 minutes on face to face counseling of the patient on the disease of obesity and what our program can do for their medical conditions as  well as in preventing future diseases. I discussed the importance of comprehensive care in the treatment of obesity including mental well being and physical activity.  I have reviewed the above documentation for accuracy and completeness, and I agree with the above. Barnie JINNY Jenkins, D.O.  The 21st Century Cures Act was signed into law in 2016 which includes the topic of electronic health records.  This provides immediate access to information in MyChart.  This includes consultation notes, operative notes, office notes, lab results and pathology reports.  If you have any questions about what you read please let us  know at your next visit so we can discuss your concerns and take corrective action if need be.  We are right here with you!

## 2023-09-12 ENCOUNTER — Encounter: Admitting: Family Medicine

## 2023-09-26 DIAGNOSIS — Z0289 Encounter for other administrative examinations: Secondary | ICD-10-CM

## 2023-10-30 NOTE — Progress Notes (Unsigned)
 [address] [place],  [place], [place] [address]  Office: [phone]  /  Fax: [phone]   Subjective   Initial Visit  Cindy Summers (MR# [id number]) is a 38 y.o. female who presents for evaluation and treatment of obesity and related comorbidities. Current BMI is There is no height or weight on file to calculate BMI. Cindy Summers has been struggling with her weight for many years and has been unsuccessful in either losing weight, maintaining weight loss, or reaching her healthy weight goal.  Cindy Summers is currently in the action stage of change and ready to dedicate time achieving and maintaining a healthier weight. Cindy Summers is interested in becoming our patient and working on intensive lifestyle modifications including (but not limited to) diet and exercise for weight loss.  Weight history:  When asked how their weight has affected their life and health, she states: {EMWeightAffected:28305}  When asked what else they would like to accomplish? She states: {EMHopetoaccomplish:28304::Adopt a healthier eating pattern and lifestyle,Improve energy levels and physical activity,Improve existing medical conditions,Improve quality of life}  She starting to note weight gain during : {emstartedtogainweight:31588}.  Life events associated with weight gain include : {emlifeeventsweighgain:31589}.   Other contributing factors: {EMcontributingfactors:28307}.  Their highest weight has been:  *** lbs.  Desired weight: ***  Previous weight-loss programs : {emweightlossprograms:31590::None}.  Their maximum weight loss was:  *** lbs.  Their greatest challenge with dieting: {emgreatestchallengediet:31593}.  Current or previous pharmacotherapy: {EM previousRx:28311}.  Response to medication: {EMResponsetomedication:28312}   Nutritional History:  Current nutrition plan: {EMNutritionplan:28309::None}.  How many times do you eat outside the home: {emfrequency:31645}  How often do they skip meals:  {emskipmeals:31594::does not skip meals}  What beverages do they drink: {embeverages:31595}.   Use of artificial sweetners : {Yes/No:30480221}  Food intolerances or dislikes: {emfoodintolerance:31596::none}.  Food triggers: {emfoodtriggers:31600::None}.  Food cravings: {emfoodcravings:31601}  Do they struggle with excessive hunger or portion control : {YES/NO:21197}   Physical Activity:  Current level of physical activity: {EMcurrentPA:28310::None}  Barriers to Exercise: {embarrierstoexercise:32606::no barriers}   Past medical history includes:   Past Medical History:  Diagnosis Date   History of ADHD    History of anxiety disorder    History of bipolar disorder      Objective   There were no vitals taken for this visit. She was weighed on the bioimpedance scale: There is no height or weight on file to calculate BMI.    Anthropometrics:  No data recorded No data recorded No data recorded No data recorded  Physical Exam:  General: She is overweight, cooperative, alert, well developed, and in no acute distress. PSYCH: Has normal mood, affect and thought process.   HEENT: EOMI, sclerae are anicteric. Lungs: Normal breathing effort, no conversational dyspnea. Extremities: No edema.  Neurologic: No gross sensory or motor deficits. No tremors or fasciculations noted.    Diagnostic Data Reviewed  EKG: Normal sinus rhythm, rate ***. No conduction abnormalities, abnormal Q waves or chamber enlargement.  Indirect Calorimeter completed today shows a VO2 of *** and a REE of ***.  Her calculated basal metabolic rate is *** thus her {zfprmzdlou:68397}.  Depression Screen  Special's PHQ-9 score was: ***.     05/31/2022   11:14 AM  Depression screen PHQ 2/9  Decreased Interest 1  Down, Depressed, Hopeless 0  PHQ - 2 Score 1  Altered sleeping 1  Tired, decreased energy 1  Change in appetite 0  Feeling bad or failure about yourself  0  Trouble concentrating  1  Moving slowly or fidgety/restless  0  Suicidal thoughts 0  PHQ-9 Score 4  Difficult doing work/chores Not difficult at all    Screening for Sleep Related Breathing Disorders  Cindy Summers {Actions; denies/reports/admits to:19208} daytime somnolence and {Actions; denies/reports/admits to:19208} waking up still tired. Patient has a history of symptoms of {OSA Sx:17850}. Cindy Summers generally gets {numbers (fuzzy):14653} hours of sleep per night, and states that she has {sleep quality:17851}. Snoring {is/are not:32546} present. Apneic episodes {is/are not:32546} present. Epworth Sleepiness Score is ***.   BMET    Component Value Date/Time   NA 138 12/19/2022 1549   K 4.3 12/19/2022 1549   CL 102 12/19/2022 1549   CO2 23 12/19/2022 1549   GLUCOSE 90 12/19/2022 1549   GLUCOSE 86 03/31/2021 0333   BUN 11 12/19/2022 1549   CREATININE 0.71 12/19/2022 1549   CALCIUM  9.5 12/19/2022 1549   GFRNONAA >60 03/31/2021 0333   No results found for: HGBA1C No results found for: INSULIN CBC    Component Value Date/Time   WBC 11.2 (H) 08/02/2023 1515   WBC 8.8 03/31/2021 0333   RBC 5.08 08/02/2023 1515   RBC 4.61 03/31/2021 0333   HGB 14.8 08/02/2023 1515   HCT 44.9 08/02/2023 1515   PLT 362 08/02/2023 1515   MCV 88 08/02/2023 1515   MCH 29.1 08/02/2023 1515   MCH 29.3 03/31/2021 0333   MCHC 33.0 08/02/2023 1515   MCHC 32.9 03/31/2021 0333   RDW 12.2 08/02/2023 1515   Iron/TIBC/Ferritin/ %Sat No results found for: IRON, TIBC, FERRITIN, IRONPCTSAT Lipid Panel     Component Value Date/Time   CHOL 185 05/24/2022 0842   TRIG 100 05/24/2022 0842   HDL 59 05/24/2022 0842   CHOLHDL 3.1 05/24/2022 0842   CHOLHDL 2 04/05/2021 1523   VLDL 42.4 (H) 04/05/2021 1523   LDLCALC 108 (H) 05/24/2022 0842   LDLDIRECT 58.0 04/05/2021 1523   Hepatic Function Panel     Component Value Date/Time   PROT 6.6 12/19/2022 1549   ALBUMIN 4.3 12/19/2022 1549   AST 18 12/19/2022 1549   ALT 17 12/19/2022  1549   ALKPHOS 87 12/19/2022 1549   BILITOT <0.2 12/19/2022 1549      Component Value Date/Time   TSH 1.680 12/19/2022 1549     Assessment and Plan   TREATMENT PLAN FOR OBESITY:  Recommended Dietary Goals  Cindy Summers is currently in the action stage of change. As such, her goal is to implement medically supervised obesity management plan.  She has agreed to implement: {emwtlossplannewint:31639}  Behavioral Intervention  We discussed the following Behavioral Modification Strategies today: {EMwtlossstrategiesnewint:31640::increasing lean protein intake to established goals,decreasing simple carbohydrates ,increasing vegetables,increasing lower glycemic fruits,increasing fiber rich foods,avoiding skipping meals,increasing water intake,work on meal planning and preparation,reading food labels ,keeping healthy foods at home,identifying sources and decreasing liquid calories,decreasing eating out or consumption of processed foods, and making healthy choices when eating convenient foods,planning for success,better snacking choices}  Additional resources provided today: {emadditionalresourcesnewint:32116::Handout on healthy eating and balanced plate,Handout on complex carbohydrates and lean sources of protein,Handout principles of weight management}  Recommended Physical Activity Goals  Cindy Summers has been advised to work up to 150 minutes of moderate intensity aerobic activity a week and strengthening exercises 2-3 times per week for cardiovascular health, weight loss maintenance and preservation of muscle mass.   She has agreed to :  {EMEXERCISE:28847::Think about enjoyable ways to increase daily physical activity and overcoming barriers to exercise,Increase physical activity in their day and reduce sedentary time (increase NEAT).,Increase volume of physical activity to  a goal of 240 minutes a week,Combine aerobic and strengthening exercises for efficiency and  improved cardiometabolic health.}  Medical Interventions and Pharmacotherapy We will work on building a Therapist, art and behavioral strategies. We will discuss the role of pharmacotherapy as an adjunct at subsequent visits.   ASSOCIATED CONDITIONS ADDRESSED TODAY  Other Fatigue ***. Cindy Summers does feel that her weight is causing her energy to be lower than it should be. Fatigue may be related to obesity, depression or many other causes. Labs will be ordered, and in the meanwhile, Cindy Summers will focus on self care including making healthy food choices, increasing physical activity and focusing on stress reduction.  Shortness of Breath Cindy Summers notes increasing shortness of breath with physical activity and seems to be worsening over time with weight gain. She notes getting out of breath sooner with activity than she used to. This has not gotten worse recently. Cindy Summers denies shortness of breath at rest or orthopnea.  Multiple sclerosis  Bipolar affective disorder, remission status unspecified (HCC)  Attention deficit hyperactivity disorder (ADHD), predominantly inattentive type  Pure hypercholesterolemia  Class 1 obesity with serious comorbidity and body mass index (BMI) of 31.0 to 31.9 in adult, unspecified obesity type    Follow-up  She was informed of the importance of frequent follow-up visits to maximize her success with intensive lifestyle modifications for her multiple health conditions. She was informed we would discuss her lab results at her next visit unless there is a critical issue that needs to be addressed sooner. Cindy Summers agreed to keep her next visit at the agreed upon time to discuss these results.  Attestation Statement  This is the patient's intake visit at [facility]. The patient's Health Questionnaire was reviewed at length. Included in the packet: current and past health history, medications, allergies, ROS, gynecologic history (women only),  surgical history, family history, social history, weight history, weight loss surgery history (for those that have had weight loss surgery), nutritional evaluation, mood and food questionnaire, PHQ9, Epworth questionnaire, sleep habits questionnaire, patient life and health improvement goals questionnaire. These will all be scanned into the patient's chart under media.   During the visit, I independently reviewed the patient's EKG***, previous labs, bioimpedance scale results, and indirect calorimetry results. I used this information to medically tailor a meal plan for the patient that will help her to lose weight and will improve her obesity-related conditions. I performed a medically necessary appropriate examination and/or evaluation. I discussed the assessment and treatment plan with the patient. The patient was provided an opportunity to ask questions and all were answered. The patient agreed with the plan and demonstrated an understanding of the instructions. Labs were ordered at this visit and will be reviewed at the next visit unless critical results need to be addressed immediately. Clinical information was updated and documented in the EMR.   In addition, they received basic education on identification of processed foods and reduction of these, different sources of lean proteins and complex carbohydrates and how to eat balanced by incorporation of whole foods.  Reviewed by clinician on day of visit: allergies, medications, problem list, medical history, surgical history, family history, social history, and previous encounter notes.  I have spent *** minutes in the care of the patient today including: {NUMBER 1-10:22536} minutes before the visit reviewing and preparing the chart. *** minutes face-to-face {emfacetoface:32598::assessing and reviewing listed medical problems as outlined in obesity care plan,providing nutritional and behavioral counseling on topics outlined in the obesity care  plan,independently interpreting test results and goals of care, as described in assessment and plan,reviewing and discussing biometric information and progress} {NUMBER 1-10:22536} minutes after the visit updating chart and documentation of encounter.       Ethridge Sollenberger ANP-C

## 2023-10-31 ENCOUNTER — Encounter (INDEPENDENT_AMBULATORY_CARE_PROVIDER_SITE_OTHER): Payer: Self-pay | Admitting: Nurse Practitioner

## 2023-10-31 ENCOUNTER — Ambulatory Visit (INDEPENDENT_AMBULATORY_CARE_PROVIDER_SITE_OTHER): Admitting: Nurse Practitioner

## 2023-10-31 VITALS — BP 123/76 | HR 101 | Temp 98.2°F | Ht 67.0 in | Wt 200.0 lb

## 2023-10-31 DIAGNOSIS — G35D Multiple sclerosis, unspecified: Secondary | ICD-10-CM | POA: Diagnosis not present

## 2023-10-31 DIAGNOSIS — R5383 Other fatigue: Secondary | ICD-10-CM | POA: Diagnosis not present

## 2023-10-31 DIAGNOSIS — T733XXA Exhaustion due to excessive exertion, initial encounter: Secondary | ICD-10-CM | POA: Insufficient documentation

## 2023-10-31 DIAGNOSIS — Z1331 Encounter for screening for depression: Secondary | ICD-10-CM | POA: Diagnosis not present

## 2023-10-31 DIAGNOSIS — E7800 Pure hypercholesterolemia, unspecified: Secondary | ICD-10-CM

## 2023-10-31 DIAGNOSIS — F900 Attention-deficit hyperactivity disorder, predominantly inattentive type: Secondary | ICD-10-CM

## 2023-10-31 DIAGNOSIS — Z6831 Body mass index (BMI) 31.0-31.9, adult: Secondary | ICD-10-CM

## 2023-10-31 DIAGNOSIS — R0602 Shortness of breath: Secondary | ICD-10-CM

## 2023-10-31 DIAGNOSIS — F3189 Other bipolar disorder: Secondary | ICD-10-CM

## 2023-10-31 DIAGNOSIS — E559 Vitamin D deficiency, unspecified: Secondary | ICD-10-CM

## 2023-10-31 DIAGNOSIS — E66811 Obesity, class 1: Secondary | ICD-10-CM

## 2023-10-31 DIAGNOSIS — F319 Bipolar disorder, unspecified: Secondary | ICD-10-CM

## 2023-10-31 NOTE — Addendum Note (Signed)
 Addended byBETHA DONALDS, Delmon Andrada L on: 10/31/2023 12:09 PM   Modules accepted: Orders

## 2023-11-01 ENCOUNTER — Ambulatory Visit (INDEPENDENT_AMBULATORY_CARE_PROVIDER_SITE_OTHER): Payer: Self-pay | Admitting: Nurse Practitioner

## 2023-11-01 LAB — CBC WITH DIFFERENTIAL/PLATELET
Basophils Absolute: 0 x10E3/uL (ref 0.0–0.2)
Basos: 0 %
EOS (ABSOLUTE): 0.1 x10E3/uL (ref 0.0–0.4)
Eos: 1 %
Hematocrit: 43.1 % (ref 34.0–46.6)
Hemoglobin: 14 g/dL (ref 11.1–15.9)
Immature Grans (Abs): 0 x10E3/uL (ref 0.0–0.1)
Immature Granulocytes: 0 %
Lymphocytes Absolute: 2.1 x10E3/uL (ref 0.7–3.1)
Lymphs: 23 %
MCH: 29.2 pg (ref 26.6–33.0)
MCHC: 32.5 g/dL (ref 31.5–35.7)
MCV: 90 fL (ref 79–97)
Monocytes Absolute: 0.9 x10E3/uL (ref 0.1–0.9)
Monocytes: 9 %
Neutrophils Absolute: 6 x10E3/uL (ref 1.4–7.0)
Neutrophils: 67 %
Platelets: 335 x10E3/uL (ref 150–450)
RBC: 4.8 x10E6/uL (ref 3.77–5.28)
RDW: 12.9 % (ref 11.7–15.4)
WBC: 9 x10E3/uL (ref 3.4–10.8)

## 2023-11-01 LAB — COMPREHENSIVE METABOLIC PANEL WITH GFR
ALT: 21 IU/L (ref 0–32)
AST: 22 IU/L (ref 0–40)
Albumin: 4.8 g/dL (ref 3.9–4.9)
Alkaline Phosphatase: 72 IU/L (ref 41–116)
BUN/Creatinine Ratio: 18 (ref 9–23)
BUN: 17 mg/dL (ref 6–20)
Bilirubin Total: 0.6 mg/dL (ref 0.0–1.2)
CO2: 20 mmol/L (ref 20–29)
Calcium: 9.5 mg/dL (ref 8.7–10.2)
Chloride: 99 mmol/L (ref 96–106)
Creatinine, Ser: 0.95 mg/dL (ref 0.57–1.00)
Globulin, Total: 2.2 g/dL (ref 1.5–4.5)
Glucose: 91 mg/dL (ref 70–99)
Potassium: 4.1 mmol/L (ref 3.5–5.2)
Sodium: 140 mmol/L (ref 134–144)
Total Protein: 7 g/dL (ref 6.0–8.5)
eGFR: 80 mL/min/1.73

## 2023-11-01 LAB — VITAMIN D 25 HYDROXY (VIT D DEFICIENCY, FRACTURES): Vit D, 25-Hydroxy: 49.1 ng/mL (ref 30.0–100.0)

## 2023-11-01 LAB — LIPID PANEL
Chol/HDL Ratio: 4.5 ratio — ABNORMAL HIGH (ref 0.0–4.4)
Cholesterol, Total: 219 mg/dL — ABNORMAL HIGH (ref 100–199)
HDL: 49 mg/dL (ref >39)
LDL Chol Calc (NIH): 147 mg/dL — ABNORMAL HIGH (ref 0–99)
Triglycerides: 126 mg/dL (ref 0–149)
VLDL Cholesterol Cal: 23 mg/dL (ref 5–40)

## 2023-11-01 LAB — INSULIN, RANDOM: INSULIN: 8.4 u[IU]/mL (ref 2.6–24.9)

## 2023-11-01 LAB — HEMOGLOBIN A1C
Est. average glucose Bld gHb Est-mCnc: 105 mg/dL
Hgb A1c MFr Bld: 5.3 % (ref 4.8–5.6)

## 2023-11-01 LAB — MAGNESIUM: Magnesium: 2 mg/dL (ref 1.6–2.3)

## 2023-11-01 LAB — VITAMIN B12: Vitamin B-12: 942 pg/mL (ref 232–1245)

## 2023-11-01 LAB — TSH: TSH: 0.731 u[IU]/mL (ref 0.450–4.500)

## 2023-11-14 ENCOUNTER — Ambulatory Visit (INDEPENDENT_AMBULATORY_CARE_PROVIDER_SITE_OTHER): Admitting: Nurse Practitioner

## 2023-11-14 ENCOUNTER — Encounter (INDEPENDENT_AMBULATORY_CARE_PROVIDER_SITE_OTHER): Payer: Self-pay | Admitting: Nurse Practitioner

## 2023-11-14 VITALS — BP 123/84 | HR 107 | Temp 97.9°F | Ht 67.0 in | Wt 195.0 lb

## 2023-11-14 DIAGNOSIS — E7800 Pure hypercholesterolemia, unspecified: Secondary | ICD-10-CM | POA: Diagnosis not present

## 2023-11-14 DIAGNOSIS — F900 Attention-deficit hyperactivity disorder, predominantly inattentive type: Secondary | ICD-10-CM | POA: Diagnosis not present

## 2023-11-14 DIAGNOSIS — G35D Multiple sclerosis, unspecified: Secondary | ICD-10-CM

## 2023-11-14 DIAGNOSIS — E66811 Obesity, class 1: Secondary | ICD-10-CM | POA: Diagnosis not present

## 2023-11-14 DIAGNOSIS — Z6830 Body mass index (BMI) 30.0-30.9, adult: Secondary | ICD-10-CM

## 2023-11-14 NOTE — Progress Notes (Signed)
 Office: [phone]  /  Fax: [phone]  WEIGHT SUMMARY AND BIOMETRICS  Weight Lost Since Last Visit: 5 lb  Weight Gained Since Last Visit: 0   Vitals Temp: 97.9 F (36.6 C) BP: 123/84 Pulse Rate: (!) 107 SpO2: 99 %   Anthropometric Measurements Height: 5' 7 (1.702 m) Weight: 195 lb (88.5 kg) BMI (Calculated): 30.53 Weight at Last Visit: 200 lb Weight Lost Since Last Visit: 5 lb Weight Gained Since Last Visit: 0 Starting Weight: 200 lb Total Weight Loss (lbs): 5 lb (2.268 kg) Peak Weight: 204 lb   Body Composition  Body Fat %: 38.2 % Fat Mass (lbs): 74.6 lbs Muscle Mass (lbs): 114.6 lbs Total Body Water (lbs): 78.2 lbs Visceral Fat Rating : 7   Other Clinical Data Fasting: No Labs: No Today's Visit #: 2 Starting Date: 10/31/23    Total Weight Loss: 5 pounds  Bio Impedance Data reviewed with patient: Muscle is down 0.2 pounds, adipose is down 4.4 pounds. PBF decreased from 39.5% to 38.2%. Her visceral fat rating went from 8 to 7.    HPI  Chief Complaint: OBESITY  Cindy Summers is here to discuss her progress with her obesity treatment plan. She is on the the Category 2 Plan and states she is following her eating plan approximately 85% of the time. She states she is not currently exercising.  Has been focusing on adapting to the nutrition plan.    Interval History:  Since last office visit she has been only doing the program x 10 days. She has been fully doing the program x 10 days- she has been skipping some meals, usually breakfast. Not certain if she is getting 100 grams of protein every day.  She has been getting her snacks have been sugar free popsicles, almonds or cashews, gluten free chips with fresh salsa.She has only been drinking water-up to 90 ounces of water daily. She is tracking fruits and vegetables and getting 4 servings of vegetables and 2 servings of fruit daily. If she doesn't make breakfast she will drink protein shakes- almond milk from [employer] with 20 gram [employer] protein powder.   She does plan to start doing [place] workouts on [place] everyday.   She does have ADHD and is currently on Adderall 20 mg every day and 30 mg BID- currently well controlled.   Her MS is currently well controlled without medication  Lab results are reviewed with patient in detail. Electrolytes, kidney functions, liver enzymes, glucose,  Vit B12, blood count, insulin, A1c and thyroid  are all normal. Her lipid panel did show pure hypercholesterolemia- currently on no medication for treatment.  She has had elevations to LDL in the past and would eat healthier and would return to normal range. She does have a history of Vit D deficiency but is currently in normal range on Cholecalciferol  4000 units daily.   Cancer screenings are reviewed with patient as obesity is a risk cancer for certain cancers.  Pap: 04/05/21 Negative repeat due in 3 years  PHYSICAL EXAM:  Blood pressure 123/84, pulse (!) 107, temperature 97.9 F (36.6 C), height 5' 7 (1.702 m), weight 195 lb (88.5 kg), SpO2 99%. Body mass index is 30.54 kg/m.  General: Well Developed, well nourished, and in no acute distress.  HEENT: Normocephalic, atraumatic; EOMI, sclerae are anicteric. Skin: Warm and dry, good turgor Chest:  Normal excursion, shape, no gross ABN Respiratory: No conversational dyspnea; speaking in full sentences NeuroM-Sk:  Normal gross ROM * 4 extremities  Psych: A  and O X 3, insight adequate, mood- full    DIAGNOSTIC DATA REVIEWED:  Last metabolic panel Lab Results  Component Value Date   GLUCOSE 91 10/31/2023   NA 140 10/31/2023   K 4.1 10/31/2023   CL 99 10/31/2023   CO2 20 10/31/2023   BUN 17 10/31/2023   CREATININE 0.95 10/31/2023   EGFR 80 10/31/2023   CALCIUM  9.5 10/31/2023   PROT 7.0 10/31/2023   ALBUMIN 4.8 10/31/2023   LABGLOB 2.2 10/31/2023   AGRATIO 1.8 05/24/2022   BILITOT 0.6 10/31/2023   ALKPHOS 72 10/31/2023   AST 22 10/31/2023   ALT 21  10/31/2023   ANIONGAP 9 03/31/2021     Lab Results  Component Value Date   HGBA1C 5.3 10/31/2023   Lab Results  Component Value Date   INSULIN 8.4 10/31/2023   Lab Results  Component Value Date   TSH 0.731 10/31/2023   CBC    Component Value Date/Time   WBC 9.0 10/31/2023 1043   WBC 8.8 03/31/2021 0333   RBC 4.80 10/31/2023 1043   RBC 4.61 03/31/2021 0333   HGB 14.0 10/31/2023 1043   HCT 43.1 10/31/2023 1043   PLT 335 10/31/2023 1043   MCV 90 10/31/2023 1043   MCH 29.2 10/31/2023 1043   MCH 29.3 03/31/2021 0333   MCHC 32.5 10/31/2023 1043   MCHC 32.9 03/31/2021 0333   RDW 12.9 10/31/2023 1043    Lab Results  Component Value Date   CHOL 219 (H) 10/31/2023   HDL 49 10/31/2023   LDLCALC 147 (H) 10/31/2023   LDLDIRECT 58.0 04/05/2021   TRIG 126 10/31/2023   CHOLHDL 4.5 (H) 10/31/2023     Nutritional Lab Results  Component Value Date   VD25OH 49.1 10/31/2023   Lab Results  Component Value Date   VITAMINB12 942 10/31/2023      ASSESSMENT AND PLAN  Class 1 obesity with serious comorbidity and body mass index (BMI) of 30.0 to 30.9 in adult, unspecified obesity type TREATMENT PLAN FOR OBESITY:  Recommended Dietary Goals  Daly is currently in the action stage of change. As such, her goal is to continue weight management plan. She has agreed to the Category 2 Plan.  Behavioral Intervention  We discussed the following Behavioral Modification Strategies today: work on meal planning and preparation, better snacking choices, continue to work on maintaining a reduced calorie state, getting the recommended amount of protein, incorporating whole foods, making healthy choices, staying well hydrated and practicing mindfulness when eating., and increase protein intake, fibrous foods (25 grams per day for women, 30 grams for men) and water to improve satiety and decrease hunger signals. .  Additional resources provided today: NA  Recommended Physical Activity  Goals  Tyannah has been advised to work up to 150 minutes of moderate intensity aerobic activity a week and strengthening exercises 2-3 times per week for cardiovascular health, weight loss maintenance and preservation of muscle mass.   She has agreed to Think about enjoyable ways to increase daily physical activity and overcoming barriers to exercise, Increase physical activity in their day and reduce sedentary time (increase NEAT)., Increase volume of physical activity to a goal of 240 minutes a week, and Combine aerobic and strengthening exercises for efficiency and improved cardiometabolic health.   Pharmacotherapy We discussed various medication options to help Meosha with her weight loss efforts and we both agreed to continue nutrition and exercise.  ASSOCIATED CONDITIONS ADDRESSED TODAY  Action/Plan  Multiple sclerosis  Continue to follow neurologist       Work on category 2 meal plan for weight loss to help with mobility  Attention deficit hyperactivity disorder (ADHD), predominantly inattentive type       Continue to follow with psychiatrist for treatment       Continue Adderall 20 mg every day AND 30 MG BID  Pure hypercholesterolemia Focus on implementing category 2 meal plan, limit saturated fats Loss of 10-15% body weight can improve lipid levels Focus on getting 240 minutes a week of moderate to high intensity exercise with strength training        Return in about 2 weeks (around 11/28/2023).SABRA She was informed of the importance of frequent follow up visits to maximize her success with intensive lifestyle modifications for her multiple health conditions.   ATTESTASTION STATEMENTS:  Reviewed by clinician on day of visit: allergies, medications, problem list, medical history, surgical history, family history, social history, and previous encounter notes.   I personally spent a total of 45 minutes in the care of the patient today including preparing to see the patient,  getting/reviewing separately obtained history, performing a medically appropriate exam/evaluation, counseling and educating, documenting clinical information in the EHR, communicating results, and coordinating care.   Vanity Larsson ANP-C

## 2023-11-29 ENCOUNTER — Ambulatory Visit (INDEPENDENT_AMBULATORY_CARE_PROVIDER_SITE_OTHER): Payer: Self-pay | Admitting: Nurse Practitioner

## 2023-12-06 ENCOUNTER — Ambulatory Visit (INDEPENDENT_AMBULATORY_CARE_PROVIDER_SITE_OTHER): Admitting: Nurse Practitioner

## 2023-12-18 ENCOUNTER — Encounter (INDEPENDENT_AMBULATORY_CARE_PROVIDER_SITE_OTHER): Payer: Self-pay | Admitting: Nurse Practitioner

## 2023-12-18 ENCOUNTER — Ambulatory Visit (INDEPENDENT_AMBULATORY_CARE_PROVIDER_SITE_OTHER): Admitting: Nurse Practitioner

## 2023-12-18 VITALS — BP 115/74 | HR 109 | Temp 98.5°F | Ht 67.0 in | Wt 192.0 lb

## 2023-12-18 DIAGNOSIS — E66811 Obesity, class 1: Secondary | ICD-10-CM | POA: Diagnosis not present

## 2023-12-18 DIAGNOSIS — E559 Vitamin D deficiency, unspecified: Secondary | ICD-10-CM | POA: Diagnosis not present

## 2023-12-18 DIAGNOSIS — E7800 Pure hypercholesterolemia, unspecified: Secondary | ICD-10-CM | POA: Diagnosis not present

## 2023-12-18 DIAGNOSIS — G35D Multiple sclerosis, unspecified: Secondary | ICD-10-CM | POA: Diagnosis not present

## 2023-12-18 DIAGNOSIS — Z6830 Body mass index (BMI) 30.0-30.9, adult: Secondary | ICD-10-CM

## 2023-12-18 NOTE — Progress Notes (Signed)
 Office: [phone]  /  Fax: [phone]  WEIGHT SUMMARY AND BIOMETRICS  Weight Lost Since Last Visit: 3 lb  Weight Gained Since Last Visit: 0   Vitals Temp: 98.5 F (36.9 C) BP: 115/74 Pulse Rate: (!) 109 SpO2: 100 %   Anthropometric Measurements Height: 5' 7 (1.702 m) Weight: 192 lb (87.1 kg) BMI (Calculated): 30.06 Weight at Last Visit: 195 lb Weight Lost Since Last Visit: 3 lb Weight Gained Since Last Visit: 0 Starting Weight: 200 lb Total Weight Loss (lbs): 8 lb (3.629 kg) Peak Weight: 204 lb   Body Composition  Body Fat %: 38 % Fat Mass (lbs): 73.2 lbs Muscle Mass (lbs): 113.4 lbs Total Body Water (lbs): 82.6 lbs Visceral Fat Rating : 7   Other Clinical Data Fasting: no Labs: no Today's Visit #: 3 Starting Date: 10/31/23    Total Weight Loss: 8 pounds Percent of body weight lost: 4%   Bio Impedance Data reviewed with patient:Muscle is down 1.2 pounds, adipose is down 1.4 pounds. PBF decreased from 38.2% to 38%.   HPI  Chief Complaint: OBESITY  Cindy Summers is here to discuss her progress with her obesity treatment plan. She is on the the Category 2 Plan and states she is following her eating plan approximately 85-90 % of the time. She states she is currently not exercising   Interval History:  Since last office visit she has been getting 100-120 grams of protein. She has not eaten out since starting on the nutrition plan. She has been doing street tacos or taco salads. She is doing fajitas.  Thanksgiving- she is going to the mountains - does not like Thanksgiving foods- will take a jersey mike sub as her Thanksgiving She plans to start working out starting 12/24/23- plans to start ladder app on phone- initial goal of 2 workouts a week.  Breakfast- chobani or oikos greek yogurt with berries.   Kanitra does have MS , symptoms are managed currently with Ocrevus with doses in August and September.  She follows regularly with neurology- Dr. Vear Rocky does  have Vit D deficiency which is currently controlled with Vit D3 4000 units daily.   She does have hyperlipidemia which she is currently trying to manage with nutrition, exercise and weight loss.   PHYSICAL EXAM:  Blood pressure 115/74, pulse (!) 109, temperature 98.5 F (36.9 C), height 5' 7 (1.702 m), weight 192 lb (87.1 kg), SpO2 100%. Body mass index is 30.07 kg/m.  General: Well Developed, well nourished, and in no acute distress.  HEENT: Normocephalic, atraumatic; EOMI, sclerae are anicteric. Skin: Warm and dry, good turgor Chest:  Normal excursion, shape, no gross ABN Respiratory: No conversational dyspnea; speaking in full sentences NeuroM-Sk:  Normal gross ROM * 4 extremities  Psych: A and O X 3, insight adequate, mood- full    DIAGNOSTIC DATA REVIEWED:  Last metabolic panel Lab Results  Component Value Date   GLUCOSE 91 10/31/2023   NA 140 10/31/2023   K 4.1 10/31/2023   CL 99 10/31/2023   CO2 20 10/31/2023   BUN 17 10/31/2023   CREATININE 0.95 10/31/2023   EGFR 80 10/31/2023   CALCIUM  9.5 10/31/2023   PROT 7.0 10/31/2023   ALBUMIN 4.8 10/31/2023   LABGLOB 2.2 10/31/2023   AGRATIO 1.8 05/24/2022   BILITOT 0.6 10/31/2023   ALKPHOS 72 10/31/2023   AST 22 10/31/2023   ALT 21 10/31/2023   ANIONGAP 9 03/31/2021     Lab Results  Component Value Date  HGBA1C 5.3 10/31/2023   Lab Results  Component Value Date   INSULIN  8.4 10/31/2023   Lab Results  Component Value Date   TSH 0.731 10/31/2023   CBC    Component Value Date/Time   WBC 9.0 10/31/2023 1043   WBC 8.8 03/31/2021 0333   RBC 4.80 10/31/2023 1043   RBC 4.61 03/31/2021 0333   HGB 14.0 10/31/2023 1043   HCT 43.1 10/31/2023 1043   PLT 335 10/31/2023 1043   MCV 90 10/31/2023 1043   MCH 29.2 10/31/2023 1043   MCH 29.3 03/31/2021 0333   MCHC 32.5 10/31/2023 1043   MCHC 32.9 03/31/2021 0333   RDW 12.9 10/31/2023 1043    Lipid Panel     Component Value Date/Time   CHOL 219 (H)  10/31/2023 1043   TRIG 126 10/31/2023 1043   HDL 49 10/31/2023 1043   CHOLHDL 4.5 (H) 10/31/2023 1043   CHOLHDL 2 04/05/2021 1523   VLDL 42.4 (H) 04/05/2021 1523   LDLCALC 147 (H) 10/31/2023 1043   LDLDIRECT 58.0 04/05/2021 1523    Nutritional Lab Results  Component Value Date   VD25OH 49.1 10/31/2023   Lab Results  Component Value Date   VITAMINB12 942 10/31/2023     ASSESSMENT AND PLAN  Class 1 obesity with serious comorbidity and body mass index (BMI) of 30.0 to 30.9 in adult, unspecified obesity type TREATMENT PLAN FOR OBESITY:  Recommended Dietary Goals  Darsi is currently in the action stage of change. As such, her goal is to continue weight management plan. She has agreed to the Category 2 Plan.  Behavioral Intervention  We discussed the following Behavioral Modification Strategies today: increasing lean protein intake to established goals, avoiding skipping meals, work on meal planning and preparation, decreasing eating out or consumption of processed foods, and making healthy choices when eating convenient foods, celebration eating strategies- one plate with protein as largest portion, clean vegetable and then portion of anything she wants to try but food cannot touch on the plate, be mindful and enjoy your food  , continue to work on maintaining a reduced calorie state, getting the recommended amount of protein, incorporating whole foods, making healthy choices, staying well hydrated and practicing mindfulness when eating., and increase protein intake, fibrous foods (25 grams per day for women, 30 grams for men) and water to improve satiety and decrease hunger signals. .  Additional resources provided today: Reviewed skinnytaste website and how to look for recipes that meet her nutrition plan  Recommended Physical Activity Goals  Aly has been advised to work up to 150 minutes of moderate intensity aerobic activity a week and strengthening exercises 2-3 times per week  for cardiovascular health, weight loss maintenance and preservation of muscle mass.   She has agreed to Patient will begin to exercise 30 minutes 2 days a week as initial goal, Think about enjoyable ways to increase daily physical activity and overcoming barriers to exercise, and Increase physical activity in their day and reduce sedentary time (increase NEAT).   Pharmacotherapy We discussed various medication options to help Rada with her weight loss efforts and we both agreed to continue nutrition and behavior modification.  ASSOCIATED CONDITIONS ADDRESSED TODAY  Action/Plan  Multiple sclerosis       Continue Ocrevus as prescribed       Continue to follow regularly with neurology Dr Vear       Adjust exercise as needed, monitor symptoms  Pure hypercholesterolemia Focus on implementing category 3 meal plan, limit saturated  fats Loss of 10-15% body weight can improve lipid levels Focus on getting 150 minutes a week of moderate to high intensity exercise   Vitamin D  deficiency       Continue Vit D3 4000 units daily       Vitamin d  supplementation has been shown to decrease fatigue, decrease risk of progression to insulin  resistance and then prediabetes, decreases risk of falling in older age and can even assist in decreasing depressive symptoms in PTSD           No follow-ups on file.SABRA She was informed of the importance of frequent follow up visits to maximize her success with intensive lifestyle modifications for her multiple health conditions.   ATTESTASTION STATEMENTS:  Reviewed by clinician on day of visit: allergies, medications, problem list, medical history, surgical history, family history, social history, and previous encounter notes.   I personally spent a total of 38 minutes in the care of the patient today including preparing to see the patient, getting/reviewing separately obtained history, performing a medically appropriate exam/evaluation, counseling and  educating, and documenting clinical information in the EHR.   Autumn Pruitt ANP-C

## 2024-01-15 ENCOUNTER — Ambulatory Visit (INDEPENDENT_AMBULATORY_CARE_PROVIDER_SITE_OTHER): Admitting: Nurse Practitioner

## 2024-01-15 ENCOUNTER — Encounter (INDEPENDENT_AMBULATORY_CARE_PROVIDER_SITE_OTHER): Payer: Self-pay | Admitting: Nurse Practitioner

## 2024-01-15 VITALS — BP 114/72 | HR 84 | Temp 97.4°F | Ht 67.0 in | Wt 184.0 lb

## 2024-01-15 DIAGNOSIS — E559 Vitamin D deficiency, unspecified: Secondary | ICD-10-CM

## 2024-01-15 DIAGNOSIS — E7800 Pure hypercholesterolemia, unspecified: Secondary | ICD-10-CM | POA: Diagnosis not present

## 2024-01-15 DIAGNOSIS — G35D Multiple sclerosis, unspecified: Secondary | ICD-10-CM | POA: Diagnosis not present

## 2024-01-15 DIAGNOSIS — Z6828 Body mass index (BMI) 28.0-28.9, adult: Secondary | ICD-10-CM

## 2024-01-15 DIAGNOSIS — F319 Bipolar disorder, unspecified: Secondary | ICD-10-CM

## 2024-01-15 DIAGNOSIS — E663 Overweight: Secondary | ICD-10-CM | POA: Diagnosis not present

## 2024-01-15 MED ORDER — VITAMIN D3 50 MCG (2000 UT) PO CAPS: 4000.0000 [IU] | ORAL_CAPSULE | Freq: Every day | ORAL | 0 refills | Status: AC

## 2024-01-15 NOTE — Progress Notes (Signed)
 " Office: [phone]  /  Fax: [phone]  WEIGHT SUMMARY AND BIOMETRICS  Weight Lost Since Last Visit: 8 lb  Weight Gained Since Last Visit: 0   Vitals Temp: (!) 97.4 F (36.3 C) BP: 114/72 Pulse Rate: 84 SpO2: 95 %   Anthropometric Measurements Height: 5' 7 (1.702 m) Weight: 184 lb (83.5 kg) BMI (Calculated): 28.81 Weight at Last Visit: 192 lb Weight Lost Since Last Visit: 8 lb Weight Gained Since Last Visit: 0 Starting Weight: 200 lb Total Weight Loss (lbs): 16 lb (7.258 kg) Peak Weight: 204 lb   Body Composition  Body Fat %: 36.2 % Fat Mass (lbs): 66.6 lbs Muscle Mass (lbs): 111.4 lbs Total Body Water (lbs): 74.2 lbs Visceral Fat Rating : 6   Other Clinical Data Fasting: yes Labs: no Today's Visit #: 4 Starting Date: 10/31/23    Total Weight Loss:16 pounds Percent of body weight lost: 8% Bio Impedance Data reviewed with patient: Muscle is down 2 pounds and adipose is down 6.6 pounds  HPI  Chief Complaint: OBESITY  Cindy Summers is here to discuss her progress with her obesity treatment plan. She is on the the Category 2 Plan - 1200-1300 calories with 100 grams of protein and states she is following her eating plan approximately 95 % of the time. She states she is not currently exercising.    Interval History:  Since last office visit she has been trying to get 100 grams of protein daily. She has snacked occasionally on blue tostitos. She continues to do taco salad or fajitas for dinner. Doing protein shake and yogurt in the morning. She is doing coffee protein. Water is about 5-6 bottles a day and sometimes more. She ate [employer] Mikes at Thanksgiving and then had normal meal plan. She has been getting fruits and vegetables. Her New Years Resolution is to start exercise. Wearing her oura type ring - records steps, kcals.  Cindy Summers's MS continues to be stable, symptoms are managed currently with Ocrevus with doses in August and September. She follows regularly with  neurology- Dr. Vear   She continues Cholecalciferol  4000 units daily for Vit D deficiency and denies side effects.   She continues to follow with behavioral health for bipolar and symptoms are currently managed with Lamictal 150 mg at bedtime.  She continues to work on limiting saturated fats and following Category 2 meal plan with exercise and weight loss to lower lipids.   PHYSICAL EXAM:  Blood pressure 114/72, pulse 84, temperature (!) 97.4 F (36.3 C), height 5' 7 (1.702 m), weight 184 lb (83.5 kg), SpO2 95%. Body mass index is 28.82 kg/m.  General: Well Developed, well nourished, and in no acute distress.  HEENT: Normocephalic, atraumatic; EOMI, sclerae are anicteric. Skin: Warm and dry, good turgor Chest:  Normal excursion, shape, no gross ABN Respiratory: No conversational dyspnea; speaking in full sentences NeuroM-Sk:  Normal gross ROM * 4 extremities  Psych: A and O X 3, insight adequate, mood- full    DIAGNOSTIC DATA REVIEWED:  Last metabolic panel Lab Results  Component Value Date   GLUCOSE 91 10/31/2023   NA 140 10/31/2023   K 4.1 10/31/2023   CL 99 10/31/2023   CO2 20 10/31/2023   BUN 17 10/31/2023   CREATININE 0.95 10/31/2023   EGFR 80 10/31/2023   CALCIUM  9.5 10/31/2023   PROT 7.0 10/31/2023   ALBUMIN 4.8 10/31/2023   LABGLOB 2.2 10/31/2023   AGRATIO 1.8 05/24/2022   BILITOT 0.6 10/31/2023   ALKPHOS 72 10/31/2023  AST 22 10/31/2023   ALT 21 10/31/2023   ANIONGAP 9 03/31/2021     Lab Results  Component Value Date   HGBA1C 5.3 10/31/2023   Lab Results  Component Value Date   INSULIN  8.4 10/31/2023   Lab Results  Component Value Date   TSH 0.731 10/31/2023   CBC    Component Value Date/Time   WBC 9.0 10/31/2023 1043   WBC 8.8 03/31/2021 0333   RBC 4.80 10/31/2023 1043   RBC 4.61 03/31/2021 0333   HGB 14.0 10/31/2023 1043   HCT 43.1 10/31/2023 1043   PLT 335 10/31/2023 1043   MCV 90 10/31/2023 1043   MCH 29.2 10/31/2023 1043    MCH 29.3 03/31/2021 0333   MCHC 32.5 10/31/2023 1043   MCHC 32.9 03/31/2021 0333   RDW 12.9 10/31/2023 1043   Lipid Panel  Lab Results  Component Value Date   CHOL 219 (H) 10/31/2023   HDL 49 10/31/2023   LDLCALC 147 (H) 10/31/2023   LDLDIRECT 58.0 04/05/2021   TRIG 126 10/31/2023   CHOLHDL 4.5 (H) 10/31/2023   Nutritional Lab Results  Component Value Date   VD25OH 49.1 10/31/2023   Lab Results  Component Value Date   VITAMINB12 942 10/31/2023      ASSESSMENT AND PLAN  Overweight with body mass index (BMI) of 28 to 28.9 in adult  TREATMENT PLAN FOR OBESITY:  Recommended Dietary Goals  Cindy Summers is currently in the action stage of change. As such, her goal is to continue weight management plan. She has agreed to the Category 2 Plan.  Behavioral Intervention  We discussed the following Behavioral Modification Strategies today: increasing lean protein intake to established goals, increasing vegetables, avoiding skipping meals, increasing water intake , celebration eating strategies, continue to work on maintaining a reduced calorie state, getting the recommended amount of protein, incorporating whole foods, making healthy choices, staying well hydrated and practicing mindfulness when eating., and increase protein intake, fibrous foods (25 grams per day for women, 30 grams for men) and water to improve satiety and decrease hunger signals. .  She wants to lose weight in December - She does  recognize that she must follow a structured plan and will eat before social situation to meet her protein goals and minimze party foods - She will give away food gifts unless they are very low calories or high protein - She will avoid calorie-containing liquids such as holiday drinks, eggnog and alcohol -She will stick to her structured plan strictly most of the time - This strategy should help her lose 1-2 pounds in December   Recommended Physical Activity Goals  Cindy Summers has been advised to  work up to 150 minutes of moderate intensity aerobic activity a week and strengthening exercises 2-3 times per week for cardiovascular health, weight loss maintenance and preservation of muscle mass.   She has agreed to Increase physical activity in their day and reduce sedentary time (increase NEAT). and Start aerobic activity with a goal of 150 minutes a week at moderate intensity.    Pharmacotherapy We discussed various medication options to help Cindy Summers with her weight loss efforts and we both agreed to continue Cholecalciferol  4000 units every day for Vit D deficiency- denies side effects. .  ASSOCIATED CONDITIONS ADDRESSED TODAY  Action/Plan  Multiple sclerosis       Continue to follow regularly with Dr. Vear       Symptoms are currently stable  Pure hypercholesterolemia Focus on implementing category 2 meal plan, limit  saturated fats. Increase lean protein, fiber and water.  Continue to follow regularly with PCP Focus on getting 150 minutes a week of moderate to high intensity exercise   Vitamin D  deficiency Low vitamin D  levels can be associated with adiposity and may result in leptin resistance and weight gain. Also associated with fatigue.  Currently on vitamin D  supplementation without any adverse effects such as nausea, vomiting or muscle weakness.   -     Vitamin D3; Take 2 capsules (4,000 Units total) by mouth daily.  Dispense: 180 capsule; Refill: 0  Bipolar affective disorder, remission status unspecified (HCC)      Continue to follow with behavioral health regularly      Symptoms are currently controlled on Lamictal 150 mg at bedtime      Continue to monitor symptoms          No follow-ups on file.Cindy Summers She was informed of the importance of frequent follow up visits to maximize her success with intensive lifestyle modifications for her multiple health conditions.   ATTESTASTION STATEMENTS:  Reviewed by clinician on day of visit: allergies, medications, problem  list, medical history, surgical history, family history, social history, and previous encounter notes.     Lonell Liverpool ANP-C "

## 2024-01-23 ENCOUNTER — Telehealth: Payer: Self-pay | Admitting: *Deleted

## 2024-01-23 ENCOUNTER — Encounter: Payer: Self-pay | Admitting: Neurology

## 2024-01-23 NOTE — Telephone Encounter (Signed)
 Need MS diagnosis code for [employer] benefits investigation.

## 2024-01-28 NOTE — Telephone Encounter (Signed)
 I relayed to [facility].

## 2024-02-14 ENCOUNTER — Ambulatory Visit (INDEPENDENT_AMBULATORY_CARE_PROVIDER_SITE_OTHER): Admitting: Nurse Practitioner

## 2024-02-14 ENCOUNTER — Encounter (INDEPENDENT_AMBULATORY_CARE_PROVIDER_SITE_OTHER): Payer: Self-pay | Admitting: Nurse Practitioner

## 2024-02-14 VITALS — BP 104/71 | HR 104 | Temp 98.3°F | Ht 67.0 in | Wt 182.0 lb

## 2024-02-14 DIAGNOSIS — E559 Vitamin D deficiency, unspecified: Secondary | ICD-10-CM | POA: Diagnosis not present

## 2024-02-14 DIAGNOSIS — E7800 Pure hypercholesterolemia, unspecified: Secondary | ICD-10-CM

## 2024-02-14 DIAGNOSIS — F3170 Bipolar disorder, currently in remission, most recent episode unspecified: Secondary | ICD-10-CM

## 2024-02-14 DIAGNOSIS — G35D Multiple sclerosis, unspecified: Secondary | ICD-10-CM

## 2024-02-14 DIAGNOSIS — Z6828 Body mass index (BMI) 28.0-28.9, adult: Secondary | ICD-10-CM

## 2024-02-14 DIAGNOSIS — F319 Bipolar disorder, unspecified: Secondary | ICD-10-CM

## 2024-02-14 DIAGNOSIS — E663 Overweight: Secondary | ICD-10-CM | POA: Diagnosis not present

## 2024-02-14 NOTE — Progress Notes (Signed)
 " Office: [phone]  /  Fax: [phone]  WEIGHT SUMMARY AND BIOMETRICS  Weight Lost Since Last Visit: 2 lb  Weight Gained Since Last Visit: 0   Vitals Temp: 98.3 F (36.8 C) BP: 104/71 Pulse Rate: (!) 104 SpO2: 97 %   Anthropometric Measurements Height: 5' 7 (1.702 m) Weight: 182 lb (82.6 kg) BMI (Calculated): 28.5 Weight at Last Visit: 184 lb Weight Lost Since Last Visit: 2 lb Weight Gained Since Last Visit: 0 Starting Weight: 200 lb Total Weight Loss (lbs): 18 lb (8.165 kg) Peak Weight: 204 lb   Body Composition  Body Fat %: 35.8 % Fat Mass (lbs): 65.2 lbs Muscle Mass (lbs): 111 lbs Total Body Water (lbs): 76.2 lbs Visceral Fat Rating : 6   Other Clinical Data Fasting: no Labs: no Today's Visit #: 5 Starting Date: 10/31/23    Total Weight Loss:18 pounds  Percent of body weight lost: 9%  Bio Impedance Data reviewed with patient: Muscle is down 0.4 pounds, adipose is down 1.4 pounds.  HPI  Chief Complaint: OBESITY  Cindy Summers is here to discuss her progress with her obesity treatment plan. She is on the the Category 2 Plan and states she is following her eating plan approximately 90-95 % of the time. She states she does have a walking pad and she was walking 10 miles a day but now she is only doing 2-3 miles 3-4 times a week.    Interval History:  Since last office visit she has been trying to get 100 grams of protein daily. She has not been getting in her entire 1200 calories every day. She has stopped tracking some of her little snacks. She does plan to subscribe to beach [employer] to do workouts. She previously did extend barre on this program that had strength training and aerobic exercises. [place] the [place]. She did have some TCBY and did not have the whole thing. She has only had french fries 2 times since starting the program.  She will occasionally eat 2 meals with snacks. Does track her intake daily. More protein at beginning  and end of the day.   She continues to follow regularly with Dr. Vear, neurology twice a year for MS- next ocrevus infusion is next month and is twice a year.   She continues to work on nutrition , exercise and weight loss to improve lipid levels.  Lab Results  Component Value Date   CHOL 219 (H) 10/31/2023   HDL 49 10/31/2023   LDLCALC 147 (H) 10/31/2023   LDLDIRECT 58.0 04/05/2021   TRIG 126 10/31/2023   CHOLHDL 4.5 (H) 10/31/2023    She continues was unable to get 4000 units of cholecalciferol  daily for Vit D deficiency filled at pharmacy. She is going to get cholecalciferol  5000 units once a day from over the counter. She does go to the tanning bed for her skin.  Last vitamin D  Lab Results  Component Value Date   VD25OH 49.1 10/31/2023    Continue on Lamictal 150 mg at bedtime for bipolar and symptoms are currently controlled.   PHYSICAL EXAM:  Blood pressure 104/71, pulse (!) 104, temperature 98.3 F (36.8 C), height 5' 7 (1.702 m), weight 182 lb (82.6 kg), SpO2 97%. Body mass index is 28.51 kg/m.  General: Well Developed, well nourished, and in no acute distress.  HEENT: Normocephalic, atraumatic; EOMI, sclerae are anicteric. Skin: Warm and dry, good turgor Chest:  Normal excursion, shape, no gross ABN Respiratory: No conversational  dyspnea; speaking in full sentences NeuroM-Sk:  Normal gross ROM * 4 extremities  Psych: A and O X 3, insight adequate, mood- full    DIAGNOSTIC DATA REVIEWED:  BMET    Component Value Date/Time   NA 140 10/31/2023 1043   K 4.1 10/31/2023 1043   CL 99 10/31/2023 1043   CO2 20 10/31/2023 1043   GLUCOSE 91 10/31/2023 1043   GLUCOSE 86 03/31/2021 0333   BUN 17 10/31/2023 1043   CREATININE 0.95 10/31/2023 1043   CALCIUM  9.5 10/31/2023 1043   GFRNONAA >60 03/31/2021 0333   Lab Results  Component Value Date   HGBA1C 5.3 10/31/2023   Lab Results  Component Value Date   INSULIN  8.4 10/31/2023   Lab Results  Component Value  Date   TSH 0.731 10/31/2023   CBC    Component Value Date/Time   WBC 9.0 10/31/2023 1043   WBC 8.8 03/31/2021 0333   RBC 4.80 10/31/2023 1043   RBC 4.61 03/31/2021 0333   HGB 14.0 10/31/2023 1043   HCT 43.1 10/31/2023 1043   PLT 335 10/31/2023 1043   MCV 90 10/31/2023 1043   MCH 29.2 10/31/2023 1043   MCH 29.3 03/31/2021 0333   MCHC 32.5 10/31/2023 1043   MCHC 32.9 03/31/2021 0333   RDW 12.9 10/31/2023 1043   Iron Studies No results found for: IRON, TIBC, FERRITIN, IRONPCTSAT Lipid Panel     Component Value Date/Time   CHOL 219 (H) 10/31/2023 1043   TRIG 126 10/31/2023 1043   HDL 49 10/31/2023 1043   CHOLHDL 4.5 (H) 10/31/2023 1043   CHOLHDL 2 04/05/2021 1523   VLDL 42.4 (H) 04/05/2021 1523   LDLCALC 147 (H) 10/31/2023 1043   LDLDIRECT 58.0 04/05/2021 1523   Hepatic Function Panel     Component Value Date/Time   PROT 7.0 10/31/2023 1043   ALBUMIN 4.8 10/31/2023 1043   AST 22 10/31/2023 1043   ALT 21 10/31/2023 1043   ALKPHOS 72 10/31/2023 1043   BILITOT 0.6 10/31/2023 1043      Component Value Date/Time   TSH 0.731 10/31/2023 1043   Nutritional Lab Results  Component Value Date   VD25OH 49.1 10/31/2023     ASSESSMENT AND PLAN Overweight with body mass index (BMI) of 28 to 28.9 in adult TREATMENT PLAN FOR OBESITY:  Recommended Dietary Goals  Cindy Summers is currently in the action stage of change. As such, her goal is to continue weight management plan. She has agreed to the Category 2 Plan.  Behavioral Intervention  We discussed the following Behavioral Modification Strategies today: continue to work on maintaining a reduced calorie state, getting the recommended amount of protein, incorporating whole foods, making healthy choices, staying well hydrated and practicing mindfulness when eating. and increase protein intake, fibrous foods (25 grams per day for women, 30 grams for men) and water to improve satiety and decrease hunger signals.  .    Recommended Physical Activity Goals  Cindy Summers has been advised to work up to 150 minutes of moderate intensity aerobic activity a week and strengthening exercises 2-3 times per week for cardiovascular health, weight loss maintenance and preservation of muscle mass.   She has agreed to [employer] strengthening exercises with a goal of 2-3 sessions a week  and Start aerobic activity with a goal of 150 minutes a week at moderate intensity. Plans to join beach [employer] to do workouts that incorporate cardio and strength training   Pharmacotherapy We discussed various medication options to help Cindy Summers with her  weight loss efforts and we both agreed to continue nutrition and behavior modification.  ASSOCIATED CONDITIONS ADDRESSED TODAY  Action/Plan  Bipolar affective disorder, remission status unspecified (HCC)       Continue Lamictal 150 mg every day       Continue to monitor symptoms and follow regularly with behavioral health  Multiple sclerosis       Continue to follow with Dr. Vear, neurology twice a year       Continue ocrevus infusion twice a year. Next dose is next month       Continue to monitor symptoms  Vitamin D  deficiency       Low vitamin D  levels can be associated with adiposity and may result in leptin resistance and weight gain. Also associated with fatigue.        Currently on vitamin D  supplementation( Vit D3 5000 units daily) without any adverse effects such as nausea, vomiting or muscle weakness.    Pure hypercholesterolemia Focus on implementing category 2 meal plan, limit saturated fats. Increased lean protein, fiber and water. Continue to follow regularly with PCP Focus on getting 150 minutes a week of moderate to high intensity exercise           Return in about 4 weeks (around 03/13/2024).SABRA She was informed of the importance of frequent follow up visits to maximize her success with intensive lifestyle modifications for her multiple health  conditions.   ATTESTASTION STATEMENTS:  Reviewed by clinician on day of visit: allergies, medications, problem list, medical history, surgical history, family history, social history, and previous encounter notes.   I personally spent a total of 35 minutes in the care of the patient today including preparing to see the patient, getting/reviewing separately obtained history, performing a medically appropriate exam/evaluation, counseling and educating, and documenting clinical information in the EHR.   Cindy Summers ANP-C "

## 2024-03-06 ENCOUNTER — Ambulatory Visit: Admitting: Neurology

## 2024-03-13 ENCOUNTER — Ambulatory Visit (INDEPENDENT_AMBULATORY_CARE_PROVIDER_SITE_OTHER): Admitting: Nurse Practitioner
